# Patient Record
Sex: Male | Born: 1950 | Race: Black or African American | Hispanic: No | Smoking: Current every day smoker
Health system: Southern US, Community
[De-identification: ages and names within clinical notes are randomized; demographics above are authoritative.]

## PROBLEM LIST (undated history)

## (undated) DIAGNOSIS — N189 Chronic kidney disease, unspecified: Secondary | ICD-10-CM

## (undated) DIAGNOSIS — E119 Type 2 diabetes mellitus without complications: Secondary | ICD-10-CM

## (undated) DIAGNOSIS — E785 Hyperlipidemia, unspecified: Secondary | ICD-10-CM

## (undated) DIAGNOSIS — J40 Bronchitis, not specified as acute or chronic: Secondary | ICD-10-CM

## (undated) DIAGNOSIS — I4891 Unspecified atrial fibrillation: Secondary | ICD-10-CM

## (undated) DIAGNOSIS — I499 Cardiac arrhythmia, unspecified: Secondary | ICD-10-CM

## (undated) DIAGNOSIS — G4730 Sleep apnea, unspecified: Secondary | ICD-10-CM

## (undated) DIAGNOSIS — I10 Essential (primary) hypertension: Secondary | ICD-10-CM

## (undated) HISTORY — PX: HIP SURGERY: SHX245

## (undated) HISTORY — DX: Type 2 diabetes mellitus without complications: E11.9

## (undated) HISTORY — PX: OTHER SURGICAL HISTORY: SHX169

## (undated) HISTORY — DX: Hyperlipidemia, unspecified: E78.5

## (undated) HISTORY — PX: FOOT SURGERY: SHX648

## (undated) HISTORY — DX: Essential (primary) hypertension: I10

## (undated) HISTORY — PX: CHOLECYSTECTOMY: SHX55

## (undated) HISTORY — PX: ROTATOR CUFF REPAIR: SHX139

---

## 2001-12-06 ENCOUNTER — Emergency Department (HOSPITAL_COMMUNITY): Admission: EM | Admit: 2001-12-06 | Discharge: 2001-12-06 | Payer: Self-pay | Admitting: Emergency Medicine

## 2002-04-25 ENCOUNTER — Encounter: Payer: Self-pay | Admitting: Internal Medicine

## 2002-04-25 ENCOUNTER — Ambulatory Visit (HOSPITAL_COMMUNITY): Admission: RE | Admit: 2002-04-25 | Discharge: 2002-04-25 | Payer: Self-pay | Admitting: Internal Medicine

## 2002-09-03 ENCOUNTER — Ambulatory Visit (HOSPITAL_COMMUNITY): Admission: RE | Admit: 2002-09-03 | Discharge: 2002-09-03 | Payer: Self-pay | Admitting: Family Medicine

## 2002-09-03 ENCOUNTER — Encounter: Payer: Self-pay | Admitting: Family Medicine

## 2002-09-19 ENCOUNTER — Observation Stay (HOSPITAL_COMMUNITY): Admission: RE | Admit: 2002-09-19 | Discharge: 2002-09-20 | Payer: Self-pay | Admitting: General Surgery

## 2006-08-23 ENCOUNTER — Ambulatory Visit (HOSPITAL_COMMUNITY): Admission: RE | Admit: 2006-08-23 | Discharge: 2006-08-23 | Payer: Self-pay | Admitting: Family Medicine

## 2006-11-14 ENCOUNTER — Ambulatory Visit (HOSPITAL_COMMUNITY): Admission: RE | Admit: 2006-11-14 | Discharge: 2006-11-14 | Payer: Self-pay | Admitting: Family Medicine

## 2007-06-13 ENCOUNTER — Ambulatory Visit (HOSPITAL_COMMUNITY): Admission: RE | Admit: 2007-06-13 | Discharge: 2007-06-13 | Payer: Self-pay | Admitting: Family Medicine

## 2007-06-30 ENCOUNTER — Encounter: Admission: RE | Admit: 2007-06-30 | Discharge: 2007-06-30 | Payer: Self-pay | Admitting: Family Medicine

## 2010-09-17 ENCOUNTER — Other Ambulatory Visit (HOSPITAL_COMMUNITY): Payer: Self-pay | Admitting: Urology

## 2010-09-17 DIAGNOSIS — R3129 Other microscopic hematuria: Secondary | ICD-10-CM

## 2010-09-22 ENCOUNTER — Ambulatory Visit (HOSPITAL_COMMUNITY)
Admission: RE | Admit: 2010-09-22 | Discharge: 2010-09-22 | Disposition: A | Discharge status: Home / Self Care | Payer: BC Managed Care – PPO | Source: Ambulatory Visit | Attending: Urology | Admitting: Urology

## 2010-09-22 DIAGNOSIS — R3129 Other microscopic hematuria: Secondary | ICD-10-CM | POA: Insufficient documentation

## 2010-09-22 DIAGNOSIS — R9389 Abnormal findings on diagnostic imaging of other specified body structures: Secondary | ICD-10-CM | POA: Insufficient documentation

## 2010-09-22 MED ORDER — IOHEXOL 300 MG/ML  SOLN
125.0000 mL | Freq: Once | INTRAMUSCULAR | Status: AC | PRN
  Administered 2010-09-22: 125 mL via INTRAVENOUS

## 2010-10-08 ENCOUNTER — Other Ambulatory Visit (HOSPITAL_COMMUNITY): Payer: Self-pay | Admitting: Urology

## 2010-10-08 DIAGNOSIS — N2889 Other specified disorders of kidney and ureter: Secondary | ICD-10-CM

## 2010-10-13 ENCOUNTER — Other Ambulatory Visit (HOSPITAL_COMMUNITY): Payer: Self-pay | Admitting: Urology

## 2010-10-13 ENCOUNTER — Ambulatory Visit (HOSPITAL_COMMUNITY)
Admission: RE | Admit: 2010-10-13 | Discharge: 2010-10-13 | Disposition: A | Discharge status: Home / Self Care | Payer: BC Managed Care – PPO | Source: Ambulatory Visit | Attending: Urology | Admitting: Urology

## 2010-10-13 DIAGNOSIS — N2889 Other specified disorders of kidney and ureter: Secondary | ICD-10-CM

## 2010-10-13 DIAGNOSIS — R3129 Other microscopic hematuria: Secondary | ICD-10-CM | POA: Insufficient documentation

## 2010-10-13 DIAGNOSIS — R9389 Abnormal findings on diagnostic imaging of other specified body structures: Secondary | ICD-10-CM | POA: Insufficient documentation

## 2010-10-13 MED ORDER — GADOBENATE DIMEGLUMINE 529 MG/ML IV SOLN: 20.0000 mL | Freq: Once | INTRAVENOUS | Status: AC | PRN

## 2010-11-11 ENCOUNTER — Other Ambulatory Visit (HOSPITAL_COMMUNITY): Payer: Self-pay | Admitting: Podiatry

## 2010-11-11 DIAGNOSIS — M79669 Pain in unspecified lower leg: Secondary | ICD-10-CM

## 2010-11-11 DIAGNOSIS — I82409 Acute embolism and thrombosis of unspecified deep veins of unspecified lower extremity: Secondary | ICD-10-CM

## 2010-11-12 ENCOUNTER — Ambulatory Visit (HOSPITAL_COMMUNITY)
Admission: RE | Admit: 2010-11-12 | Discharge: 2010-11-12 | Disposition: A | Discharge status: Home / Self Care | Payer: BC Managed Care – PPO | Source: Ambulatory Visit | Attending: Podiatry | Admitting: Podiatry

## 2010-11-12 DIAGNOSIS — M79609 Pain in unspecified limb: Secondary | ICD-10-CM | POA: Insufficient documentation

## 2010-11-12 DIAGNOSIS — M79669 Pain in unspecified lower leg: Secondary | ICD-10-CM

## 2010-11-12 DIAGNOSIS — I82409 Acute embolism and thrombosis of unspecified deep veins of unspecified lower extremity: Secondary | ICD-10-CM

## 2010-11-13 NOTE — Op Note (Signed)
NAME:  Alex Morton, Alex Morton                     ACCOUNT NO.:  [id number]   MEDICAL RECORD NO.:  [id number]                   PATIENT TYPE:  AMB   LOCATION:  DAY                                  FACILITY:  [facility]   PHYSICIAN:  Barbaraann Barthel, M.D.              DATE OF BIRTH:  07-Jan-1951   DATE OF PROCEDURE:  09/19/2002  DATE OF DISCHARGE:                                 OPERATIVE REPORT   SURGEON:  Barbaraann Barthel, M.D.   ASSISTANT:  Dr. Emelda Fear.   PREOPERATIVE DIAGNOSES:  Cholecystitis secondary to cholelithiasis.   POSTOPERATIVE DIAGNOSES:  Cholecystitis secondary to cholelithiasis.   PROCEDURE:  Laparoscopic cholecystectomy.   SPECIMENS:  Gallbladder with stones.   INDICATIONS FOR PROCEDURE:  Note, this is a 60 year old black male who had  long-term complaints of right upper quadrant pain. He was noted on  sonography to have a thickened gallbladder with multiple stones within it.  He was nontender and his liver function studies and amylase were within  normal limits preoperatively.   We discussed surgery with this patient discussion complications not limited  to but including bleeding, infection, damage to bile ducts, perforation of  organs and transitory diarrhea. Informed consent was obtained.   GROSS FINDINGS:  The patient had a very thickened wizened type of  gallbladder with multiple small stones within it, a short cystic duct which  was not cannulated and multiple adhesions in the right upper quadrant. In  order to grasp the gallbladder, we had to remove about 5 mL of sludge from  the gallbladder.   TECHNIQUE:  The patient was placed in the supine position and after the  adequate administration of general anesthesia via endotracheal intubation, a  Foley catheter was aseptically inserted. The patient was shaved, prepped and  draped in the usual manner. A periumbilical incision was carried out over  the superior aspect of the umbilicus. The fascia was grasped with  a sharp  towel clip and with the patient in Trendelenburg and elevating the fascia, a  Veress needle was inserted and confirmed in position with a saline drop  test. The gallbladder was then insufflated with approximately 3.5 liters of  CO2 and then using the Visiport technique, an 11 mm [name] surgical cannula was  placed in the umbilicus. Then under direct vision, three other cannulas were  placed, an 11 mm cannula in the epigastrium, and two 5 mm cannulas in the  right upper quadrant laterally. The gallbladder was grasped, it was  difficult to grasp this. I inserted a needle through the 5 mm lateral  cannula to decompress the gallbladder somewhat so we were able to grasp  this. This was done and with tedious dissection the cystic duct was  identified. This was triply silver clipped and divided as was the cystic  artery. The cystic artery came from a rather large vessel, perhaps the  hepatic artery. We removed the gallbladder then from the liver bed using  the  hook cautery device without incident. There was some oozing. This was  controlled with the cautery device and after irrigating I elected to leave a  Jackson-Pratt drain in the gallbladder bed. The gallbladder was then removed  with its stones some of which had to be retrieved from the right upper  quadrant using the EndoCatch device and then after checking for hemostasis  and irrigating the abdomen was then desufflated and the fascia was closed in  the area of the 11 mm cannula insertion sites with #0 Polysorb. Half percent  Sensorcaine was infused in the cannula site and all skin incisions were  closed with the stapling device with OpSite dressings, drains and sponges  applied. The drain was sutured in place with 3-0 nylon. Prior to closure,  all sponge, needle and instrument counts were found to be correct. Estimated  blood loss was minimal. The patient received 1900 mL of Crystalloids  intraoperatively. There were no  complications.                                               Barbaraann Barthel, M.D.    Jodi Marble  D:  09/19/2002  T:  09/19/2002  Job:  [id number]   cc:   Corrie Mckusick, M.D.  [address] [place]  [place]  [place] [address]  Fax: [phone]

## 2012-09-12 ENCOUNTER — Telehealth (HOSPITAL_COMMUNITY): Payer: Self-pay | Admitting: Dietician

## 2012-09-12 NOTE — Telephone Encounter (Signed)
Pt registered to attend group diabetes class at [facility] on 09/12/12. However, pt was a no-show.  

## 2013-08-07 ENCOUNTER — Telehealth: Payer: Self-pay

## 2013-08-07 NOTE — Telephone Encounter (Signed)
Pt was referred by Edythe Clarity, PA at Dr. Delanna Ahmadi office, for a screening colonoscopy. LMOM for a return call.

## 2013-08-16 NOTE — Telephone Encounter (Signed)
Letter mailed to pt with new address. First one was returned.

## 2014-01-29 ENCOUNTER — Ambulatory Visit (INDEPENDENT_AMBULATORY_CARE_PROVIDER_SITE_OTHER): Payer: BC Managed Care – PPO

## 2014-01-29 ENCOUNTER — Ambulatory Visit: Payer: BC Managed Care – PPO

## 2014-01-29 ENCOUNTER — Ambulatory Visit (INDEPENDENT_AMBULATORY_CARE_PROVIDER_SITE_OTHER): Payer: BC Managed Care – PPO | Admitting: Orthopedic Surgery

## 2014-01-29 VITALS — BP 147/84 | Ht 73.0 in | Wt 247.0 lb

## 2014-01-29 DIAGNOSIS — M25562 Pain in left knee: Secondary | ICD-10-CM

## 2014-01-29 DIAGNOSIS — M25569 Pain in unspecified knee: Secondary | ICD-10-CM

## 2014-01-29 DIAGNOSIS — M25561 Pain in right knee: Secondary | ICD-10-CM

## 2014-01-29 NOTE — Progress Notes (Signed)
Subjective:     Patient ID: DHAVAL WOO, male   DOB: [birth year], 63 y.o.   MRN: [id number]  HPI Pain left knee. The patient is referred to [name] for left knee pain which was Shona Needles. He complains of pain and some numbness and tingling in his left knee medial side radiating to his foot associated with burning. She says it comes and goes and is now gone. He has some right lower extremity radicular symptoms which were relieved by gabapentin  His medical history of diabetes and gastric reflux disease with hypertension  He's had multiple surgeries including cholecystectomy foot surgery hip surgery shoulder surgery  He takes metoprolol amlodipine metformin gabapentin and nesina  His allergies are none his family history shows diabetes lung disease hypertension aerobic heart attack coronary artery disease and heart disease  Review of systems  Review of Systems Review of systems has been recorded reviewed and signed and scanned into the chart     Objective:   Physical Exam BP 147/84  Ht 6\' 1"  (1.854 m)  Wt 247 lb (112.038 kg)  BMI 32.59 kg/m2 General appearance is normal, the patient is alert and oriented x3 with normal mood and affect. The patient is walking normally  Both knees looked good without swelling or tenderness. His range of motion is normal stability and strength are excellent skin is intact he has good distal pulses normal sensation no lymphadenopathy and normal reflexes    Assessment:     Knee pain potential is some sciatic-type pain    Plan:     Call office when symptoms occur again

## 2015-03-19 ENCOUNTER — Other Ambulatory Visit (HOSPITAL_COMMUNITY): Payer: Self-pay | Admitting: Physician Assistant

## 2015-03-21 ENCOUNTER — Other Ambulatory Visit (HOSPITAL_COMMUNITY): Payer: Self-pay | Admitting: Physician Assistant

## 2016-01-15 DIAGNOSIS — I10 Essential (primary) hypertension: Secondary | ICD-10-CM | POA: Diagnosis not present

## 2016-01-15 DIAGNOSIS — N183 Chronic kidney disease, stage 3 (moderate): Secondary | ICD-10-CM | POA: Diagnosis not present

## 2016-01-15 DIAGNOSIS — E1129 Type 2 diabetes mellitus with other diabetic kidney complication: Secondary | ICD-10-CM | POA: Diagnosis not present

## 2016-01-15 DIAGNOSIS — R809 Proteinuria, unspecified: Secondary | ICD-10-CM | POA: Diagnosis not present

## 2016-01-23 ENCOUNTER — Other Ambulatory Visit (HOSPITAL_COMMUNITY): Payer: Self-pay | Admitting: Nephrology

## 2016-01-23 DIAGNOSIS — N183 Chronic kidney disease, stage 3 unspecified: Secondary | ICD-10-CM

## 2016-02-11 ENCOUNTER — Ambulatory Visit (HOSPITAL_COMMUNITY)
Admission: RE | Admit: 2016-02-11 | Discharge: 2016-02-11 | Disposition: A | Discharge status: Home / Self Care | Payer: Medicare Other | Source: Ambulatory Visit | Attending: Nephrology | Admitting: Nephrology

## 2016-02-11 DIAGNOSIS — N183 Chronic kidney disease, stage 3 unspecified: Secondary | ICD-10-CM

## 2016-02-11 DIAGNOSIS — I10 Essential (primary) hypertension: Secondary | ICD-10-CM | POA: Diagnosis not present

## 2016-02-11 DIAGNOSIS — E559 Vitamin D deficiency, unspecified: Secondary | ICD-10-CM | POA: Diagnosis not present

## 2016-02-11 DIAGNOSIS — R809 Proteinuria, unspecified: Secondary | ICD-10-CM | POA: Diagnosis not present

## 2016-02-11 DIAGNOSIS — D509 Iron deficiency anemia, unspecified: Secondary | ICD-10-CM | POA: Diagnosis not present

## 2016-02-11 DIAGNOSIS — Z79899 Other long term (current) drug therapy: Secondary | ICD-10-CM | POA: Diagnosis not present

## 2016-03-03 DIAGNOSIS — R809 Proteinuria, unspecified: Secondary | ICD-10-CM | POA: Diagnosis not present

## 2016-03-03 DIAGNOSIS — E538 Deficiency of other specified B group vitamins: Secondary | ICD-10-CM | POA: Diagnosis not present

## 2016-03-03 DIAGNOSIS — N183 Chronic kidney disease, stage 3 (moderate): Secondary | ICD-10-CM | POA: Diagnosis not present

## 2016-03-03 DIAGNOSIS — E559 Vitamin D deficiency, unspecified: Secondary | ICD-10-CM | POA: Diagnosis not present

## 2016-06-24 DIAGNOSIS — D509 Iron deficiency anemia, unspecified: Secondary | ICD-10-CM | POA: Diagnosis not present

## 2016-06-24 DIAGNOSIS — E559 Vitamin D deficiency, unspecified: Secondary | ICD-10-CM | POA: Diagnosis not present

## 2016-06-24 DIAGNOSIS — Z79899 Other long term (current) drug therapy: Secondary | ICD-10-CM | POA: Diagnosis not present

## 2016-06-24 DIAGNOSIS — N183 Chronic kidney disease, stage 3 (moderate): Secondary | ICD-10-CM | POA: Diagnosis not present

## 2016-06-24 DIAGNOSIS — R809 Proteinuria, unspecified: Secondary | ICD-10-CM | POA: Diagnosis not present

## 2016-06-24 DIAGNOSIS — I10 Essential (primary) hypertension: Secondary | ICD-10-CM | POA: Diagnosis not present

## 2016-06-29 DIAGNOSIS — E1129 Type 2 diabetes mellitus with other diabetic kidney complication: Secondary | ICD-10-CM | POA: Diagnosis not present

## 2016-06-29 DIAGNOSIS — N183 Chronic kidney disease, stage 3 (moderate): Secondary | ICD-10-CM | POA: Diagnosis not present

## 2016-06-29 DIAGNOSIS — Z0001 Encounter for general adult medical examination with abnormal findings: Secondary | ICD-10-CM | POA: Diagnosis not present

## 2016-06-29 DIAGNOSIS — E538 Deficiency of other specified B group vitamins: Secondary | ICD-10-CM | POA: Diagnosis not present

## 2016-06-29 DIAGNOSIS — E559 Vitamin D deficiency, unspecified: Secondary | ICD-10-CM | POA: Diagnosis not present

## 2016-06-30 DIAGNOSIS — D649 Anemia, unspecified: Secondary | ICD-10-CM | POA: Diagnosis not present

## 2016-06-30 DIAGNOSIS — N182 Chronic kidney disease, stage 2 (mild): Secondary | ICD-10-CM | POA: Diagnosis not present

## 2016-06-30 DIAGNOSIS — I10 Essential (primary) hypertension: Secondary | ICD-10-CM | POA: Diagnosis not present

## 2016-06-30 DIAGNOSIS — R809 Proteinuria, unspecified: Secondary | ICD-10-CM | POA: Diagnosis not present

## 2016-06-30 DIAGNOSIS — N250 Renal osteodystrophy: Secondary | ICD-10-CM | POA: Diagnosis not present

## 2016-08-04 DIAGNOSIS — E538 Deficiency of other specified B group vitamins: Secondary | ICD-10-CM | POA: Diagnosis not present

## 2016-08-31 DIAGNOSIS — E538 Deficiency of other specified B group vitamins: Secondary | ICD-10-CM | POA: Diagnosis not present

## 2016-11-02 DIAGNOSIS — E538 Deficiency of other specified B group vitamins: Secondary | ICD-10-CM | POA: Diagnosis not present

## 2016-11-08 DIAGNOSIS — N183 Chronic kidney disease, stage 3 (moderate): Secondary | ICD-10-CM | POA: Diagnosis not present

## 2016-11-08 DIAGNOSIS — R809 Proteinuria, unspecified: Secondary | ICD-10-CM | POA: Diagnosis not present

## 2016-11-08 DIAGNOSIS — D509 Iron deficiency anemia, unspecified: Secondary | ICD-10-CM | POA: Diagnosis not present

## 2016-11-08 DIAGNOSIS — E559 Vitamin D deficiency, unspecified: Secondary | ICD-10-CM | POA: Diagnosis not present

## 2016-11-08 DIAGNOSIS — Z79899 Other long term (current) drug therapy: Secondary | ICD-10-CM | POA: Diagnosis not present

## 2016-11-08 DIAGNOSIS — I10 Essential (primary) hypertension: Secondary | ICD-10-CM | POA: Diagnosis not present

## 2016-11-10 DIAGNOSIS — D649 Anemia, unspecified: Secondary | ICD-10-CM | POA: Diagnosis not present

## 2016-11-10 DIAGNOSIS — N183 Chronic kidney disease, stage 3 (moderate): Secondary | ICD-10-CM | POA: Diagnosis not present

## 2016-11-10 DIAGNOSIS — N250 Renal osteodystrophy: Secondary | ICD-10-CM | POA: Diagnosis not present

## 2016-11-10 DIAGNOSIS — R809 Proteinuria, unspecified: Secondary | ICD-10-CM | POA: Diagnosis not present

## 2016-11-10 DIAGNOSIS — I10 Essential (primary) hypertension: Secondary | ICD-10-CM | POA: Diagnosis not present

## 2016-11-29 DIAGNOSIS — Z1389 Encounter for screening for other disorder: Secondary | ICD-10-CM | POA: Diagnosis not present

## 2016-11-29 DIAGNOSIS — I10 Essential (primary) hypertension: Secondary | ICD-10-CM | POA: Diagnosis not present

## 2016-11-29 DIAGNOSIS — M25532 Pain in left wrist: Secondary | ICD-10-CM | POA: Diagnosis not present

## 2016-11-29 DIAGNOSIS — E1140 Type 2 diabetes mellitus with diabetic neuropathy, unspecified: Secondary | ICD-10-CM | POA: Diagnosis not present

## 2016-12-16 DIAGNOSIS — E1151 Type 2 diabetes mellitus with diabetic peripheral angiopathy without gangrene: Secondary | ICD-10-CM | POA: Diagnosis not present

## 2016-12-16 DIAGNOSIS — B351 Tinea unguium: Secondary | ICD-10-CM | POA: Diagnosis not present

## 2016-12-16 DIAGNOSIS — E1140 Type 2 diabetes mellitus with diabetic neuropathy, unspecified: Secondary | ICD-10-CM | POA: Diagnosis not present

## 2016-12-16 DIAGNOSIS — L110 Acquired keratosis follicularis: Secondary | ICD-10-CM | POA: Diagnosis not present

## 2017-02-09 ENCOUNTER — Other Ambulatory Visit (HOSPITAL_COMMUNITY): Payer: Self-pay | Admitting: Family Medicine

## 2017-02-09 ENCOUNTER — Ambulatory Visit (HOSPITAL_COMMUNITY)
Admission: RE | Admit: 2017-02-09 | Discharge: 2017-02-09 | Disposition: A | Discharge status: Home / Self Care | Payer: Medicare Other | Source: Ambulatory Visit | Attending: Family Medicine | Admitting: Family Medicine

## 2017-02-09 DIAGNOSIS — M19042 Primary osteoarthritis, left hand: Secondary | ICD-10-CM | POA: Insufficient documentation

## 2017-02-09 DIAGNOSIS — M25532 Pain in left wrist: Secondary | ICD-10-CM | POA: Diagnosis not present

## 2017-02-09 DIAGNOSIS — M24812 Other specific joint derangements of left shoulder, not elsewhere classified: Secondary | ICD-10-CM | POA: Diagnosis not present

## 2017-02-09 DIAGNOSIS — M19032 Primary osteoarthritis, left wrist: Secondary | ICD-10-CM | POA: Diagnosis not present

## 2017-02-09 DIAGNOSIS — E538 Deficiency of other specified B group vitamins: Secondary | ICD-10-CM | POA: Diagnosis not present

## 2017-02-09 DIAGNOSIS — M79642 Pain in left hand: Secondary | ICD-10-CM | POA: Diagnosis not present

## 2017-02-09 DIAGNOSIS — Z1389 Encounter for screening for other disorder: Secondary | ICD-10-CM | POA: Diagnosis not present

## 2017-02-22 DIAGNOSIS — M19032 Primary osteoarthritis, left wrist: Secondary | ICD-10-CM | POA: Diagnosis not present

## 2017-02-22 DIAGNOSIS — M25532 Pain in left wrist: Secondary | ICD-10-CM | POA: Diagnosis not present

## 2017-03-16 DIAGNOSIS — E1129 Type 2 diabetes mellitus with other diabetic kidney complication: Secondary | ICD-10-CM | POA: Diagnosis not present

## 2017-03-16 DIAGNOSIS — Z720 Tobacco use: Secondary | ICD-10-CM | POA: Diagnosis not present

## 2017-03-16 DIAGNOSIS — N183 Chronic kidney disease, stage 3 (moderate): Secondary | ICD-10-CM | POA: Diagnosis not present

## 2017-03-16 DIAGNOSIS — R809 Proteinuria, unspecified: Secondary | ICD-10-CM | POA: Diagnosis not present

## 2017-03-16 DIAGNOSIS — I10 Essential (primary) hypertension: Secondary | ICD-10-CM | POA: Diagnosis not present

## 2017-03-17 DIAGNOSIS — E1140 Type 2 diabetes mellitus with diabetic neuropathy, unspecified: Secondary | ICD-10-CM | POA: Diagnosis not present

## 2017-03-17 DIAGNOSIS — L110 Acquired keratosis follicularis: Secondary | ICD-10-CM | POA: Diagnosis not present

## 2017-03-17 DIAGNOSIS — B351 Tinea unguium: Secondary | ICD-10-CM | POA: Diagnosis not present

## 2017-03-17 DIAGNOSIS — E1151 Type 2 diabetes mellitus with diabetic peripheral angiopathy without gangrene: Secondary | ICD-10-CM | POA: Diagnosis not present

## 2017-03-28 ENCOUNTER — Other Ambulatory Visit: Payer: Self-pay | Admitting: *Deleted

## 2017-03-28 ENCOUNTER — Ambulatory Visit (INDEPENDENT_AMBULATORY_CARE_PROVIDER_SITE_OTHER): Payer: Medicare Other | Admitting: Orthopedic Surgery

## 2017-03-28 VITALS — BP 139/92 | HR 55 | Ht 73.0 in | Wt 251.0 lb

## 2017-03-28 DIAGNOSIS — M19039 Primary osteoarthritis, unspecified wrist: Secondary | ICD-10-CM | POA: Diagnosis not present

## 2017-03-28 DIAGNOSIS — M25512 Pain in left shoulder: Secondary | ICD-10-CM

## 2017-03-28 DIAGNOSIS — M7502 Adhesive capsulitis of left shoulder: Secondary | ICD-10-CM

## 2017-03-28 MED ORDER — MELOXICAM 7.5 MG PO TABS: 7.5000 mg | ORAL_TABLET | Freq: Every day | ORAL | 1 refills | Status: DC

## 2017-03-28 NOTE — Patient Instructions (Signed)
You have received an injection of steroids into the joint. 15% of patients will have increased pain within the 24 hours postinjection.   This is transient and will go away.   We recommend that you use ice packs on the injection site for 20 minutes every 2 hours and extra strength Tylenol 2 tablets every 8 as needed until the pain resolves.  If you continue to have pain after taking the Tylenol and using the ice please call the office for further instructions.  Start therapy  Stop brace  Start MOBIC 7.5 mg daily

## 2017-03-28 NOTE — Progress Notes (Signed)
NEW PATIENT OFFICE VISIT    Chief Complaint  Patient presents with  . Follow-up    ER follow up on left wrist, no injury.    66 year old male presents with painful left wrist.  He is 66 years old he is retired from [employer] he presents with a two-month history of pain and swelling in his left wrist which was treated in the ER with a topical medications including diclofenac Lido cane and menthol with some relief of his pain. He wore brace and he complains now of reduce pain but continued swelling in the left wrist.  He also notes that since that time he has acute onset of loss of motion in the left shoulder with dull aching constant mild to moderate pain over the anterior joint line with no history of trauma    Review of Systems  Respiratory: Negative for shortness of breath.   Cardiovascular: Negative for chest pain.  Skin: Negative.   Neurological: Negative.    Family history of heart disease mom and dad asthma sister and heart disease brother  Prior history of surgery includes foot surgery shoulder surgery gallbladder surgery and a second foot surgery  Medical conditions include diabetes pneumonia hypertension and arthritis   Social History  Substance Use Topics  . Smoking status: Not on file  . Smokeless tobacco: Not on file  . Alcohol use Not on file    BP (!) 139/92   Pulse (!) 55   Ht 6\' 1"  (1.854 m)   Wt 251 lb (113.9 kg)   BMI 33.12 kg/m   Physical Exam  Constitutional: He is oriented to person, place, and time. He appears well-developed and well-nourished.  Vital signs have been reviewed and are stable. Gen. appearance the patient is well-developed and well-nourished with normal grooming and hygiene.   Musculoskeletal:  GAIT IS normal  Neurological: He is alert and oriented to person, place, and time.  Skin: Skin is warm and dry. No erythema.  Psychiatric: He has a normal mood and affect.  Vitals reviewed.   Ortho Exam   I will start with his left  wrist which is swollen and tender as is his hand distal to the last wrap the brace. He has no tenderness except over the wrist joint is range of motion is somewhat diminished but painless there is no instability in the wrist he can make a full fist because of the swelling skin is intact no rash no laceration no previous ulceration neurovascular exam intact with good capillary refill normal sensation in all the involved nerves  As far as his shoulder goes he has a lot of shoulder has some tenderness over the rotator interval none of the acromial clavicular joint posterior joint line nontender I can only externally rotate his arms to neutral like an abducted 70 I can flex it 60 I couldn't test stability because the arm couldn't be brought abduction external rotation internal and external rotation abduction strength was normal with his arm at his side it did not see a rash over the skin of the shoulder or neck area. Normal sensation in the left arm normal pulses perfusion was good his neck was nontender  Meds ordered this encounter  Medications  . meloxicam (MOBIC) 7.5 MG tablet    Sig: Take 1 tablet (7.5 mg total) by mouth daily.    Dispense:  30 tablet    Refill:  1    Encounter Diagnoses  Name Primary?  . Pain in joint of left shoulder Yes  .  Wrist arthritis   . Adhesive capsulitis of left shoulder      PLAN:   Procedure note the subacromial injection shoulder left   Verbal consent was obtained to inject the  Left   Shoulder  Timeout was completed to confirm the injection site is a subacromial space of the  left  shoulder  Medication used Depo-Medrol 40 mg and lidocaine 1% 3 cc  Anesthesia was provided by ethyl chloride  The injection was performed in the left  posterior subacromial space. After pinning the skin with alcohol and anesthetized the skin with ethyl chloride the subacromial space was injected using a 20-gauge needle. There were no complications  Sterile dressing was  applied.  Meds ordered this encounter  Medications  . meloxicam (MOBIC) 7.5 MG tablet    Sig: Take 1 tablet (7.5 mg total) by mouth daily.    Dispense:  30 tablet    Refill:  1    2 months fu

## 2017-03-31 DIAGNOSIS — M542 Cervicalgia: Secondary | ICD-10-CM | POA: Diagnosis not present

## 2017-03-31 DIAGNOSIS — M6281 Muscle weakness (generalized): Secondary | ICD-10-CM | POA: Diagnosis not present

## 2017-03-31 DIAGNOSIS — M79602 Pain in left arm: Secondary | ICD-10-CM | POA: Diagnosis not present

## 2017-03-31 DIAGNOSIS — M5412 Radiculopathy, cervical region: Secondary | ICD-10-CM | POA: Diagnosis not present

## 2017-04-01 DIAGNOSIS — M542 Cervicalgia: Secondary | ICD-10-CM | POA: Diagnosis not present

## 2017-04-01 DIAGNOSIS — M6281 Muscle weakness (generalized): Secondary | ICD-10-CM | POA: Diagnosis not present

## 2017-04-01 DIAGNOSIS — M79602 Pain in left arm: Secondary | ICD-10-CM | POA: Diagnosis not present

## 2017-04-01 DIAGNOSIS — M5412 Radiculopathy, cervical region: Secondary | ICD-10-CM | POA: Diagnosis not present

## 2017-04-05 DIAGNOSIS — M79602 Pain in left arm: Secondary | ICD-10-CM | POA: Diagnosis not present

## 2017-04-05 DIAGNOSIS — M5412 Radiculopathy, cervical region: Secondary | ICD-10-CM | POA: Diagnosis not present

## 2017-04-05 DIAGNOSIS — M6281 Muscle weakness (generalized): Secondary | ICD-10-CM | POA: Diagnosis not present

## 2017-04-05 DIAGNOSIS — M542 Cervicalgia: Secondary | ICD-10-CM | POA: Diagnosis not present

## 2017-04-07 DIAGNOSIS — M542 Cervicalgia: Secondary | ICD-10-CM | POA: Diagnosis not present

## 2017-04-07 DIAGNOSIS — M6281 Muscle weakness (generalized): Secondary | ICD-10-CM | POA: Diagnosis not present

## 2017-04-07 DIAGNOSIS — M79602 Pain in left arm: Secondary | ICD-10-CM | POA: Diagnosis not present

## 2017-04-07 DIAGNOSIS — M5412 Radiculopathy, cervical region: Secondary | ICD-10-CM | POA: Diagnosis not present

## 2017-04-08 DIAGNOSIS — M79602 Pain in left arm: Secondary | ICD-10-CM | POA: Diagnosis not present

## 2017-04-08 DIAGNOSIS — M5412 Radiculopathy, cervical region: Secondary | ICD-10-CM | POA: Diagnosis not present

## 2017-04-08 DIAGNOSIS — M542 Cervicalgia: Secondary | ICD-10-CM | POA: Diagnosis not present

## 2017-04-08 DIAGNOSIS — M6281 Muscle weakness (generalized): Secondary | ICD-10-CM | POA: Diagnosis not present

## 2017-04-11 DIAGNOSIS — M6281 Muscle weakness (generalized): Secondary | ICD-10-CM | POA: Diagnosis not present

## 2017-04-11 DIAGNOSIS — M542 Cervicalgia: Secondary | ICD-10-CM | POA: Diagnosis not present

## 2017-04-11 DIAGNOSIS — M5412 Radiculopathy, cervical region: Secondary | ICD-10-CM | POA: Diagnosis not present

## 2017-04-11 DIAGNOSIS — M79602 Pain in left arm: Secondary | ICD-10-CM | POA: Diagnosis not present

## 2017-04-12 DIAGNOSIS — M542 Cervicalgia: Secondary | ICD-10-CM | POA: Diagnosis not present

## 2017-04-12 DIAGNOSIS — M6281 Muscle weakness (generalized): Secondary | ICD-10-CM | POA: Diagnosis not present

## 2017-04-12 DIAGNOSIS — M5412 Radiculopathy, cervical region: Secondary | ICD-10-CM | POA: Diagnosis not present

## 2017-04-12 DIAGNOSIS — M79602 Pain in left arm: Secondary | ICD-10-CM | POA: Diagnosis not present

## 2017-04-14 DIAGNOSIS — M6281 Muscle weakness (generalized): Secondary | ICD-10-CM | POA: Diagnosis not present

## 2017-04-14 DIAGNOSIS — M542 Cervicalgia: Secondary | ICD-10-CM | POA: Diagnosis not present

## 2017-04-14 DIAGNOSIS — M5412 Radiculopathy, cervical region: Secondary | ICD-10-CM | POA: Diagnosis not present

## 2017-04-14 DIAGNOSIS — M79602 Pain in left arm: Secondary | ICD-10-CM | POA: Diagnosis not present

## 2017-04-19 DIAGNOSIS — M542 Cervicalgia: Secondary | ICD-10-CM | POA: Diagnosis not present

## 2017-04-19 DIAGNOSIS — M5412 Radiculopathy, cervical region: Secondary | ICD-10-CM | POA: Diagnosis not present

## 2017-04-19 DIAGNOSIS — M79602 Pain in left arm: Secondary | ICD-10-CM | POA: Diagnosis not present

## 2017-04-19 DIAGNOSIS — M6281 Muscle weakness (generalized): Secondary | ICD-10-CM | POA: Diagnosis not present

## 2017-04-21 DIAGNOSIS — M5412 Radiculopathy, cervical region: Secondary | ICD-10-CM | POA: Diagnosis not present

## 2017-04-21 DIAGNOSIS — M6281 Muscle weakness (generalized): Secondary | ICD-10-CM | POA: Diagnosis not present

## 2017-04-21 DIAGNOSIS — M79602 Pain in left arm: Secondary | ICD-10-CM | POA: Diagnosis not present

## 2017-04-21 DIAGNOSIS — M542 Cervicalgia: Secondary | ICD-10-CM | POA: Diagnosis not present

## 2017-04-22 DIAGNOSIS — M5412 Radiculopathy, cervical region: Secondary | ICD-10-CM | POA: Diagnosis not present

## 2017-04-22 DIAGNOSIS — M79602 Pain in left arm: Secondary | ICD-10-CM | POA: Diagnosis not present

## 2017-04-22 DIAGNOSIS — M542 Cervicalgia: Secondary | ICD-10-CM | POA: Diagnosis not present

## 2017-04-22 DIAGNOSIS — M6281 Muscle weakness (generalized): Secondary | ICD-10-CM | POA: Diagnosis not present

## 2017-04-25 DIAGNOSIS — M5412 Radiculopathy, cervical region: Secondary | ICD-10-CM | POA: Diagnosis not present

## 2017-04-25 DIAGNOSIS — M6281 Muscle weakness (generalized): Secondary | ICD-10-CM | POA: Diagnosis not present

## 2017-04-25 DIAGNOSIS — M542 Cervicalgia: Secondary | ICD-10-CM | POA: Diagnosis not present

## 2017-04-25 DIAGNOSIS — M79602 Pain in left arm: Secondary | ICD-10-CM | POA: Diagnosis not present

## 2017-04-28 DIAGNOSIS — M542 Cervicalgia: Secondary | ICD-10-CM | POA: Diagnosis not present

## 2017-04-28 DIAGNOSIS — M6281 Muscle weakness (generalized): Secondary | ICD-10-CM | POA: Diagnosis not present

## 2017-04-28 DIAGNOSIS — M79602 Pain in left arm: Secondary | ICD-10-CM | POA: Diagnosis not present

## 2017-04-28 DIAGNOSIS — M5412 Radiculopathy, cervical region: Secondary | ICD-10-CM | POA: Diagnosis not present

## 2017-05-02 DIAGNOSIS — M542 Cervicalgia: Secondary | ICD-10-CM | POA: Diagnosis not present

## 2017-05-02 DIAGNOSIS — M79602 Pain in left arm: Secondary | ICD-10-CM | POA: Diagnosis not present

## 2017-05-02 DIAGNOSIS — M5412 Radiculopathy, cervical region: Secondary | ICD-10-CM | POA: Diagnosis not present

## 2017-05-02 DIAGNOSIS — M6281 Muscle weakness (generalized): Secondary | ICD-10-CM | POA: Diagnosis not present

## 2017-05-04 DIAGNOSIS — M79602 Pain in left arm: Secondary | ICD-10-CM | POA: Diagnosis not present

## 2017-05-04 DIAGNOSIS — M542 Cervicalgia: Secondary | ICD-10-CM | POA: Diagnosis not present

## 2017-05-04 DIAGNOSIS — M6281 Muscle weakness (generalized): Secondary | ICD-10-CM | POA: Diagnosis not present

## 2017-05-04 DIAGNOSIS — M5412 Radiculopathy, cervical region: Secondary | ICD-10-CM | POA: Diagnosis not present

## 2017-05-05 DIAGNOSIS — M542 Cervicalgia: Secondary | ICD-10-CM | POA: Diagnosis not present

## 2017-05-05 DIAGNOSIS — M5412 Radiculopathy, cervical region: Secondary | ICD-10-CM | POA: Diagnosis not present

## 2017-05-05 DIAGNOSIS — M79602 Pain in left arm: Secondary | ICD-10-CM | POA: Diagnosis not present

## 2017-05-05 DIAGNOSIS — M6281 Muscle weakness (generalized): Secondary | ICD-10-CM | POA: Diagnosis not present

## 2017-05-09 DIAGNOSIS — M79602 Pain in left arm: Secondary | ICD-10-CM | POA: Diagnosis not present

## 2017-05-09 DIAGNOSIS — M5412 Radiculopathy, cervical region: Secondary | ICD-10-CM | POA: Diagnosis not present

## 2017-05-09 DIAGNOSIS — M542 Cervicalgia: Secondary | ICD-10-CM | POA: Diagnosis not present

## 2017-05-09 DIAGNOSIS — M6281 Muscle weakness (generalized): Secondary | ICD-10-CM | POA: Diagnosis not present

## 2017-05-11 DIAGNOSIS — M5412 Radiculopathy, cervical region: Secondary | ICD-10-CM | POA: Diagnosis not present

## 2017-05-11 DIAGNOSIS — M542 Cervicalgia: Secondary | ICD-10-CM | POA: Diagnosis not present

## 2017-05-11 DIAGNOSIS — M79602 Pain in left arm: Secondary | ICD-10-CM | POA: Diagnosis not present

## 2017-05-11 DIAGNOSIS — M6281 Muscle weakness (generalized): Secondary | ICD-10-CM | POA: Diagnosis not present

## 2017-05-12 DIAGNOSIS — M79602 Pain in left arm: Secondary | ICD-10-CM | POA: Diagnosis not present

## 2017-05-12 DIAGNOSIS — M6281 Muscle weakness (generalized): Secondary | ICD-10-CM | POA: Diagnosis not present

## 2017-05-12 DIAGNOSIS — M5412 Radiculopathy, cervical region: Secondary | ICD-10-CM | POA: Diagnosis not present

## 2017-05-12 DIAGNOSIS — M542 Cervicalgia: Secondary | ICD-10-CM | POA: Diagnosis not present

## 2017-05-16 DIAGNOSIS — M542 Cervicalgia: Secondary | ICD-10-CM | POA: Diagnosis not present

## 2017-05-16 DIAGNOSIS — M79602 Pain in left arm: Secondary | ICD-10-CM | POA: Diagnosis not present

## 2017-05-16 DIAGNOSIS — M6281 Muscle weakness (generalized): Secondary | ICD-10-CM | POA: Diagnosis not present

## 2017-05-16 DIAGNOSIS — M5412 Radiculopathy, cervical region: Secondary | ICD-10-CM | POA: Diagnosis not present

## 2017-05-17 ENCOUNTER — Telehealth: Payer: Self-pay | Admitting: Orthopedic Surgery

## 2017-05-17 ENCOUNTER — Other Ambulatory Visit: Payer: Self-pay | Admitting: Orthopedic Surgery

## 2017-05-17 DIAGNOSIS — M25512 Pain in left shoulder: Secondary | ICD-10-CM

## 2017-05-17 DIAGNOSIS — M7502 Adhesive capsulitis of left shoulder: Secondary | ICD-10-CM

## 2017-05-17 MED ORDER — MELOXICAM 7.5 MG PO TABS: 7.5000 mg | ORAL_TABLET | Freq: Every day | ORAL | 5 refills | Status: DC

## 2017-05-17 NOTE — Telephone Encounter (Signed)
[facility] in [place] requesting 90 day supply for  Meloxicam 7.5 mg   Qty 90

## 2017-05-18 DIAGNOSIS — M79602 Pain in left arm: Secondary | ICD-10-CM | POA: Diagnosis not present

## 2017-05-18 DIAGNOSIS — M6281 Muscle weakness (generalized): Secondary | ICD-10-CM | POA: Diagnosis not present

## 2017-05-18 DIAGNOSIS — M542 Cervicalgia: Secondary | ICD-10-CM | POA: Diagnosis not present

## 2017-05-18 DIAGNOSIS — M5412 Radiculopathy, cervical region: Secondary | ICD-10-CM | POA: Diagnosis not present

## 2017-05-23 DIAGNOSIS — M5412 Radiculopathy, cervical region: Secondary | ICD-10-CM | POA: Diagnosis not present

## 2017-05-23 DIAGNOSIS — M542 Cervicalgia: Secondary | ICD-10-CM | POA: Diagnosis not present

## 2017-05-23 DIAGNOSIS — M6281 Muscle weakness (generalized): Secondary | ICD-10-CM | POA: Diagnosis not present

## 2017-05-23 DIAGNOSIS — M79602 Pain in left arm: Secondary | ICD-10-CM | POA: Diagnosis not present

## 2017-05-25 DIAGNOSIS — M542 Cervicalgia: Secondary | ICD-10-CM | POA: Diagnosis not present

## 2017-05-25 DIAGNOSIS — M79602 Pain in left arm: Secondary | ICD-10-CM | POA: Diagnosis not present

## 2017-05-25 DIAGNOSIS — M5412 Radiculopathy, cervical region: Secondary | ICD-10-CM | POA: Diagnosis not present

## 2017-05-25 DIAGNOSIS — M6281 Muscle weakness (generalized): Secondary | ICD-10-CM | POA: Diagnosis not present

## 2017-05-30 ENCOUNTER — Encounter: Payer: Self-pay | Admitting: Orthopedic Surgery

## 2017-05-30 ENCOUNTER — Ambulatory Visit: Payer: Medicare Other | Admitting: Orthopedic Surgery

## 2017-05-30 VITALS — BP 149/84 | HR 49 | Ht 73.0 in | Wt 250.0 lb

## 2017-05-30 DIAGNOSIS — M5412 Radiculopathy, cervical region: Secondary | ICD-10-CM | POA: Diagnosis not present

## 2017-05-30 DIAGNOSIS — M7502 Adhesive capsulitis of left shoulder: Secondary | ICD-10-CM

## 2017-05-30 DIAGNOSIS — M25512 Pain in left shoulder: Secondary | ICD-10-CM

## 2017-05-30 DIAGNOSIS — M19039 Primary osteoarthritis, unspecified wrist: Secondary | ICD-10-CM | POA: Diagnosis not present

## 2017-05-30 DIAGNOSIS — M542 Cervicalgia: Secondary | ICD-10-CM | POA: Diagnosis not present

## 2017-05-30 DIAGNOSIS — M79602 Pain in left arm: Secondary | ICD-10-CM | POA: Diagnosis not present

## 2017-05-30 DIAGNOSIS — M6281 Muscle weakness (generalized): Secondary | ICD-10-CM | POA: Diagnosis not present

## 2017-05-30 MED ORDER — MELOXICAM 7.5 MG PO TABS: 7.5000 mg | ORAL_TABLET | Freq: Every day | ORAL | 5 refills | Status: DC

## 2017-05-30 NOTE — Progress Notes (Signed)
Progress Note   Patient ID: Alex Morton, male   DOB: [birth year], 66 y.o.   MRN: [id number]  Chief Complaint  Patient presents with  . Shoulder Pain    left/ improving with physical therapy   . 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   Neurological: Negative for tingling and sensory change.   Current Meds  Medication Sig  . amLODipine (NORVASC) 10 MG tablet Take 10 mg by mouth daily.  Marland Kitchen gabapentin (NEURONTIN) 600 MG tablet Take 600 mg by mouth 3 (three) times daily. Two tabs at bedtime  . glimepiride (AMARYL) 2 MG tablet   . meloxicam (MOBIC) 7.5 MG tablet Take 1 tablet (7.5 mg total) by mouth daily.  . metoprolol (LOPRESSOR) 100 MG tablet Take 100 mg by mouth 2 (two) times daily.  . [DISCONTINUED] amLODipine (NORVASC) 10 MG tablet     No Known Allergies   BP (!) 149/84   Pulse (!) 49   Ht 6\' 1"  (1.854 m)   Wt 250 lb (113.4 kg)   BMI 32.98 kg/m   Physical Exam  Constitutional: He is oriented to person, place, and time. He appears well-developed and well-nourished.  Vital signs have been reviewed and are stable. Gen. appearance the patient is well-developed and well-nourished with normal grooming and hygiene.   Musculoskeletal:       Left shoulder: He exhibits decreased range of motion. He exhibits no tenderness, no bony tenderness, no swelling, no effusion, no crepitus, no deformity, no laceration, no pain, no spasm, normal pulse and normal strength.       Left wrist: He exhibits decreased range of motion and tenderness. He exhibits no bony tenderness, no swelling, no effusion, no crepitus, no deformity and no laceration.       Left hand: He exhibits decreased range of motion and tenderness. He  exhibits no bony tenderness, normal capillary refill, no deformity and no laceration. Decreased sensation is not present in the ulnar distribution, is not present in the medial redistribution and is not present in the radial distribution. Decreased strength noted. He exhibits thumb/finger opposition and wrist extension trouble. He exhibits no finger abduction.     Neurological: He is alert and oriented to person, place, and time.  Skin: Skin is warm and dry. No erythema.  Psychiatric: He has a normal mood and affect.  Vitals reviewed.   Examination Medical decision-making Encounter Diagnoses  Name Primary?  . Adhesive capsulitis of left shoulder Yes  . Wrist arthritis    Adhesive capsulitis minimal improvement wrist arthritis significant improvement   Meds ordered this encounter  Medications  . meloxicam (MOBIC) 7.5 MG tablet    Sig: Take 1 tablet (7.5 mg total) by mouth daily.    Dispense:  90 tablet    Refill:  5   Recommend continuation of his physical therapy and meloxicam.  His last therapy note dated November 19 indicates good improvement in pain levels and range of motion cervical spine minimal gains in the shoulder joint recommend continue therapy  Arther Abbott, MD 05/30/2017 11:35 AM

## 2017-06-02 DIAGNOSIS — M79602 Pain in left arm: Secondary | ICD-10-CM | POA: Diagnosis not present

## 2017-06-02 DIAGNOSIS — M542 Cervicalgia: Secondary | ICD-10-CM | POA: Diagnosis not present

## 2017-06-02 DIAGNOSIS — M5412 Radiculopathy, cervical region: Secondary | ICD-10-CM | POA: Diagnosis not present

## 2017-06-02 DIAGNOSIS — M6281 Muscle weakness (generalized): Secondary | ICD-10-CM | POA: Diagnosis not present

## 2017-06-07 DIAGNOSIS — M6281 Muscle weakness (generalized): Secondary | ICD-10-CM | POA: Diagnosis not present

## 2017-06-07 DIAGNOSIS — M542 Cervicalgia: Secondary | ICD-10-CM | POA: Diagnosis not present

## 2017-06-07 DIAGNOSIS — M79602 Pain in left arm: Secondary | ICD-10-CM | POA: Diagnosis not present

## 2017-06-07 DIAGNOSIS — M5412 Radiculopathy, cervical region: Secondary | ICD-10-CM | POA: Diagnosis not present

## 2017-06-09 DIAGNOSIS — M79602 Pain in left arm: Secondary | ICD-10-CM | POA: Diagnosis not present

## 2017-06-09 DIAGNOSIS — M6281 Muscle weakness (generalized): Secondary | ICD-10-CM | POA: Diagnosis not present

## 2017-06-09 DIAGNOSIS — M542 Cervicalgia: Secondary | ICD-10-CM | POA: Diagnosis not present

## 2017-06-09 DIAGNOSIS — M5412 Radiculopathy, cervical region: Secondary | ICD-10-CM | POA: Diagnosis not present

## 2017-06-13 DIAGNOSIS — M542 Cervicalgia: Secondary | ICD-10-CM | POA: Diagnosis not present

## 2017-06-13 DIAGNOSIS — M6281 Muscle weakness (generalized): Secondary | ICD-10-CM | POA: Diagnosis not present

## 2017-06-13 DIAGNOSIS — M79602 Pain in left arm: Secondary | ICD-10-CM | POA: Diagnosis not present

## 2017-06-13 DIAGNOSIS — M5412 Radiculopathy, cervical region: Secondary | ICD-10-CM | POA: Diagnosis not present

## 2017-06-16 DIAGNOSIS — M6281 Muscle weakness (generalized): Secondary | ICD-10-CM | POA: Diagnosis not present

## 2017-06-16 DIAGNOSIS — M79602 Pain in left arm: Secondary | ICD-10-CM | POA: Diagnosis not present

## 2017-06-16 DIAGNOSIS — M542 Cervicalgia: Secondary | ICD-10-CM | POA: Diagnosis not present

## 2017-06-16 DIAGNOSIS — M5412 Radiculopathy, cervical region: Secondary | ICD-10-CM | POA: Diagnosis not present

## 2017-06-22 DIAGNOSIS — M79602 Pain in left arm: Secondary | ICD-10-CM | POA: Diagnosis not present

## 2017-06-22 DIAGNOSIS — M542 Cervicalgia: Secondary | ICD-10-CM | POA: Diagnosis not present

## 2017-06-22 DIAGNOSIS — M6281 Muscle weakness (generalized): Secondary | ICD-10-CM | POA: Diagnosis not present

## 2017-06-22 DIAGNOSIS — M5412 Radiculopathy, cervical region: Secondary | ICD-10-CM | POA: Diagnosis not present

## 2017-06-23 DIAGNOSIS — M79602 Pain in left arm: Secondary | ICD-10-CM | POA: Diagnosis not present

## 2017-06-23 DIAGNOSIS — M5412 Radiculopathy, cervical region: Secondary | ICD-10-CM | POA: Diagnosis not present

## 2017-06-23 DIAGNOSIS — M6281 Muscle weakness (generalized): Secondary | ICD-10-CM | POA: Diagnosis not present

## 2017-06-23 DIAGNOSIS — M542 Cervicalgia: Secondary | ICD-10-CM | POA: Diagnosis not present

## 2017-06-27 DIAGNOSIS — M5412 Radiculopathy, cervical region: Secondary | ICD-10-CM | POA: Diagnosis not present

## 2017-06-27 DIAGNOSIS — M6281 Muscle weakness (generalized): Secondary | ICD-10-CM | POA: Diagnosis not present

## 2017-06-27 DIAGNOSIS — M542 Cervicalgia: Secondary | ICD-10-CM | POA: Diagnosis not present

## 2017-06-27 DIAGNOSIS — M79602 Pain in left arm: Secondary | ICD-10-CM | POA: Diagnosis not present

## 2017-07-04 DIAGNOSIS — M542 Cervicalgia: Secondary | ICD-10-CM | POA: Diagnosis not present

## 2017-07-04 DIAGNOSIS — M6281 Muscle weakness (generalized): Secondary | ICD-10-CM | POA: Diagnosis not present

## 2017-07-04 DIAGNOSIS — M5412 Radiculopathy, cervical region: Secondary | ICD-10-CM | POA: Diagnosis not present

## 2017-07-04 DIAGNOSIS — M79602 Pain in left arm: Secondary | ICD-10-CM | POA: Diagnosis not present

## 2017-07-07 DIAGNOSIS — M79602 Pain in left arm: Secondary | ICD-10-CM | POA: Diagnosis not present

## 2017-07-07 DIAGNOSIS — M5412 Radiculopathy, cervical region: Secondary | ICD-10-CM | POA: Diagnosis not present

## 2017-07-07 DIAGNOSIS — M542 Cervicalgia: Secondary | ICD-10-CM | POA: Diagnosis not present

## 2017-07-07 DIAGNOSIS — M6281 Muscle weakness (generalized): Secondary | ICD-10-CM | POA: Diagnosis not present

## 2017-07-11 ENCOUNTER — Encounter: Payer: Self-pay | Admitting: Orthopedic Surgery

## 2017-07-11 ENCOUNTER — Ambulatory Visit (INDEPENDENT_AMBULATORY_CARE_PROVIDER_SITE_OTHER): Payer: Medicare Other | Admitting: Orthopedic Surgery

## 2017-07-11 VITALS — BP 151/100 | HR 49 | Ht 73.0 in | Wt 257.0 lb

## 2017-07-11 DIAGNOSIS — M6281 Muscle weakness (generalized): Secondary | ICD-10-CM | POA: Diagnosis not present

## 2017-07-11 DIAGNOSIS — M25512 Pain in left shoulder: Secondary | ICD-10-CM | POA: Diagnosis not present

## 2017-07-11 DIAGNOSIS — M25612 Stiffness of left shoulder, not elsewhere classified: Secondary | ICD-10-CM | POA: Diagnosis not present

## 2017-07-11 DIAGNOSIS — M7502 Adhesive capsulitis of left shoulder: Secondary | ICD-10-CM | POA: Diagnosis not present

## 2017-07-12 NOTE — Progress Notes (Signed)
Chief Complaint  Patient presents with  . Shoulder Pain    left  . Wrist Pain    left   67 year old male presents back in follow-up for adhesive capsulitis left shoulder painful loss of motion left hand and wrist.  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

## 2017-07-12 NOTE — Progress Notes (Signed)
ERROR

## 2017-07-15 DIAGNOSIS — M6281 Muscle weakness (generalized): Secondary | ICD-10-CM | POA: Diagnosis not present

## 2017-07-15 DIAGNOSIS — M7502 Adhesive capsulitis of left shoulder: Secondary | ICD-10-CM | POA: Diagnosis not present

## 2017-07-15 DIAGNOSIS — M25612 Stiffness of left shoulder, not elsewhere classified: Secondary | ICD-10-CM | POA: Diagnosis not present

## 2017-07-15 DIAGNOSIS — M25512 Pain in left shoulder: Secondary | ICD-10-CM | POA: Diagnosis not present

## 2017-07-18 DIAGNOSIS — M25512 Pain in left shoulder: Secondary | ICD-10-CM | POA: Diagnosis not present

## 2017-07-18 DIAGNOSIS — M25612 Stiffness of left shoulder, not elsewhere classified: Secondary | ICD-10-CM | POA: Diagnosis not present

## 2017-07-18 DIAGNOSIS — M7502 Adhesive capsulitis of left shoulder: Secondary | ICD-10-CM | POA: Diagnosis not present

## 2017-07-18 DIAGNOSIS — M6281 Muscle weakness (generalized): Secondary | ICD-10-CM | POA: Diagnosis not present

## 2017-07-21 DIAGNOSIS — M7502 Adhesive capsulitis of left shoulder: Secondary | ICD-10-CM | POA: Diagnosis not present

## 2017-07-21 DIAGNOSIS — M25512 Pain in left shoulder: Secondary | ICD-10-CM | POA: Diagnosis not present

## 2017-07-21 DIAGNOSIS — M25612 Stiffness of left shoulder, not elsewhere classified: Secondary | ICD-10-CM | POA: Diagnosis not present

## 2017-07-21 DIAGNOSIS — M6281 Muscle weakness (generalized): Secondary | ICD-10-CM | POA: Diagnosis not present

## 2017-07-25 DIAGNOSIS — M6281 Muscle weakness (generalized): Secondary | ICD-10-CM | POA: Diagnosis not present

## 2017-07-25 DIAGNOSIS — M7502 Adhesive capsulitis of left shoulder: Secondary | ICD-10-CM | POA: Diagnosis not present

## 2017-07-25 DIAGNOSIS — M25612 Stiffness of left shoulder, not elsewhere classified: Secondary | ICD-10-CM | POA: Diagnosis not present

## 2017-07-25 DIAGNOSIS — M25512 Pain in left shoulder: Secondary | ICD-10-CM | POA: Diagnosis not present

## 2017-07-29 DIAGNOSIS — M7502 Adhesive capsulitis of left shoulder: Secondary | ICD-10-CM | POA: Diagnosis not present

## 2017-07-29 DIAGNOSIS — M25612 Stiffness of left shoulder, not elsewhere classified: Secondary | ICD-10-CM | POA: Diagnosis not present

## 2017-07-29 DIAGNOSIS — M25512 Pain in left shoulder: Secondary | ICD-10-CM | POA: Diagnosis not present

## 2017-07-29 DIAGNOSIS — M6281 Muscle weakness (generalized): Secondary | ICD-10-CM | POA: Diagnosis not present

## 2017-08-01 DIAGNOSIS — M7502 Adhesive capsulitis of left shoulder: Secondary | ICD-10-CM | POA: Diagnosis not present

## 2017-08-01 DIAGNOSIS — M25512 Pain in left shoulder: Secondary | ICD-10-CM | POA: Diagnosis not present

## 2017-08-01 DIAGNOSIS — M6281 Muscle weakness (generalized): Secondary | ICD-10-CM | POA: Diagnosis not present

## 2017-08-01 DIAGNOSIS — M25612 Stiffness of left shoulder, not elsewhere classified: Secondary | ICD-10-CM | POA: Diagnosis not present

## 2017-08-04 DIAGNOSIS — M6281 Muscle weakness (generalized): Secondary | ICD-10-CM | POA: Diagnosis not present

## 2017-08-04 DIAGNOSIS — M25612 Stiffness of left shoulder, not elsewhere classified: Secondary | ICD-10-CM | POA: Diagnosis not present

## 2017-08-04 DIAGNOSIS — M25512 Pain in left shoulder: Secondary | ICD-10-CM | POA: Diagnosis not present

## 2017-08-04 DIAGNOSIS — M7502 Adhesive capsulitis of left shoulder: Secondary | ICD-10-CM | POA: Diagnosis not present

## 2017-08-08 DIAGNOSIS — M25512 Pain in left shoulder: Secondary | ICD-10-CM | POA: Diagnosis not present

## 2017-08-08 DIAGNOSIS — M6281 Muscle weakness (generalized): Secondary | ICD-10-CM | POA: Diagnosis not present

## 2017-08-08 DIAGNOSIS — M25612 Stiffness of left shoulder, not elsewhere classified: Secondary | ICD-10-CM | POA: Diagnosis not present

## 2017-08-08 DIAGNOSIS — M7502 Adhesive capsulitis of left shoulder: Secondary | ICD-10-CM | POA: Diagnosis not present

## 2017-08-09 DIAGNOSIS — Z0000 Encounter for general adult medical examination without abnormal findings: Secondary | ICD-10-CM | POA: Diagnosis not present

## 2017-08-09 DIAGNOSIS — Z1389 Encounter for screening for other disorder: Secondary | ICD-10-CM | POA: Diagnosis not present

## 2017-08-09 DIAGNOSIS — E119 Type 2 diabetes mellitus without complications: Secondary | ICD-10-CM | POA: Diagnosis not present

## 2017-08-09 DIAGNOSIS — I10 Essential (primary) hypertension: Secondary | ICD-10-CM | POA: Diagnosis not present

## 2017-08-09 DIAGNOSIS — R7309 Other abnormal glucose: Secondary | ICD-10-CM | POA: Diagnosis not present

## 2017-08-09 DIAGNOSIS — Z719 Counseling, unspecified: Secondary | ICD-10-CM | POA: Diagnosis not present

## 2017-08-11 DIAGNOSIS — Z0000 Encounter for general adult medical examination without abnormal findings: Secondary | ICD-10-CM | POA: Diagnosis not present

## 2017-08-11 DIAGNOSIS — Z1389 Encounter for screening for other disorder: Secondary | ICD-10-CM | POA: Diagnosis not present

## 2017-08-11 DIAGNOSIS — E782 Mixed hyperlipidemia: Secondary | ICD-10-CM | POA: Diagnosis not present

## 2017-08-12 DIAGNOSIS — M25612 Stiffness of left shoulder, not elsewhere classified: Secondary | ICD-10-CM | POA: Diagnosis not present

## 2017-08-12 DIAGNOSIS — M7502 Adhesive capsulitis of left shoulder: Secondary | ICD-10-CM | POA: Diagnosis not present

## 2017-08-12 DIAGNOSIS — M25512 Pain in left shoulder: Secondary | ICD-10-CM | POA: Diagnosis not present

## 2017-08-12 DIAGNOSIS — M6281 Muscle weakness (generalized): Secondary | ICD-10-CM | POA: Diagnosis not present

## 2017-08-18 ENCOUNTER — Other Ambulatory Visit (HOSPITAL_COMMUNITY): Payer: Self-pay | Admitting: Family Medicine

## 2017-08-18 DIAGNOSIS — Z1389 Encounter for screening for other disorder: Secondary | ICD-10-CM

## 2017-08-30 ENCOUNTER — Ambulatory Visit (HOSPITAL_COMMUNITY)
Admission: RE | Admit: 2017-08-30 | Discharge: 2017-08-30 | Disposition: A | Discharge status: Home / Self Care | Payer: Medicare Other | Source: Ambulatory Visit | Attending: Family Medicine | Admitting: Family Medicine

## 2017-08-30 DIAGNOSIS — Z87891 Personal history of nicotine dependence: Secondary | ICD-10-CM | POA: Diagnosis not present

## 2017-08-30 DIAGNOSIS — Z136 Encounter for screening for cardiovascular disorders: Secondary | ICD-10-CM | POA: Diagnosis not present

## 2017-08-30 DIAGNOSIS — Z1389 Encounter for screening for other disorder: Secondary | ICD-10-CM | POA: Diagnosis not present

## 2017-08-30 DIAGNOSIS — I77811 Abdominal aortic ectasia: Secondary | ICD-10-CM | POA: Insufficient documentation

## 2017-08-30 DIAGNOSIS — Z363 Encounter for antenatal screening for malformations: Secondary | ICD-10-CM

## 2017-10-10 ENCOUNTER — Ambulatory Visit: Payer: Self-pay | Admitting: Orthopedic Surgery

## 2017-10-24 ENCOUNTER — Encounter: Payer: Self-pay | Admitting: Orthopedic Surgery

## 2017-10-24 ENCOUNTER — Ambulatory Visit: Payer: Medicare Other | Admitting: Orthopedic Surgery

## 2017-10-24 NOTE — Progress Notes (Deleted)
Progress Note   Patient ID: ANTWOIN LACKEY, male   DOB: [birth year], 67 y.o.   MRN: [id number]  No chief complaint on file.    Medical decision-making No diagnosis found.    No orders of the defined types were placed in this encounter.    PLAN: ***    No chief complaint on file.   HPI   ROS No outpatient medications have been marked as taking for the 10/24/17 encounter (Appointment) with Carole Civil, MD.    No Known Allergies   There were no vitals taken for this visit.  Physical Exam     Arther Abbott, MD 10/24/2017 10:28 AM

## 2018-04-28 ENCOUNTER — Ambulatory Visit (HOSPITAL_COMMUNITY)
Admission: RE | Admit: 2018-04-28 | Discharge: 2018-04-28 | Disposition: A | Discharge status: Home / Self Care | Payer: Medicare Other | Source: Ambulatory Visit | Attending: Family Medicine | Admitting: Family Medicine

## 2018-04-28 ENCOUNTER — Other Ambulatory Visit (HOSPITAL_COMMUNITY): Payer: Self-pay | Admitting: Family Medicine

## 2018-04-28 DIAGNOSIS — E119 Type 2 diabetes mellitus without complications: Secondary | ICD-10-CM | POA: Diagnosis not present

## 2018-04-28 DIAGNOSIS — Z719 Counseling, unspecified: Secondary | ICD-10-CM | POA: Diagnosis not present

## 2018-04-28 DIAGNOSIS — J069 Acute upper respiratory infection, unspecified: Secondary | ICD-10-CM

## 2018-04-28 DIAGNOSIS — E1129 Type 2 diabetes mellitus with other diabetic kidney complication: Secondary | ICD-10-CM | POA: Diagnosis not present

## 2018-04-28 DIAGNOSIS — R05 Cough: Secondary | ICD-10-CM | POA: Diagnosis not present

## 2018-04-28 DIAGNOSIS — Z1389 Encounter for screening for other disorder: Secondary | ICD-10-CM | POA: Diagnosis not present

## 2018-04-28 DIAGNOSIS — E1140 Type 2 diabetes mellitus with diabetic neuropathy, unspecified: Secondary | ICD-10-CM | POA: Diagnosis not present

## 2018-06-16 DIAGNOSIS — Z23 Encounter for immunization: Secondary | ICD-10-CM | POA: Diagnosis not present

## 2018-07-25 DIAGNOSIS — E1129 Type 2 diabetes mellitus with other diabetic kidney complication: Secondary | ICD-10-CM | POA: Diagnosis not present

## 2018-07-25 DIAGNOSIS — Z1389 Encounter for screening for other disorder: Secondary | ICD-10-CM | POA: Diagnosis not present

## 2018-07-25 DIAGNOSIS — Z0000 Encounter for general adult medical examination without abnormal findings: Secondary | ICD-10-CM | POA: Diagnosis not present

## 2018-07-25 DIAGNOSIS — G4700 Insomnia, unspecified: Secondary | ICD-10-CM | POA: Diagnosis not present

## 2018-08-29 ENCOUNTER — Ambulatory Visit: Payer: Medicare Other | Admitting: Podiatry

## 2018-08-29 ENCOUNTER — Encounter: Payer: Self-pay | Admitting: Podiatry

## 2018-08-29 VITALS — BP 161/93 | HR 58

## 2018-08-29 DIAGNOSIS — M79674 Pain in right toe(s): Secondary | ICD-10-CM | POA: Diagnosis not present

## 2018-08-29 DIAGNOSIS — B351 Tinea unguium: Secondary | ICD-10-CM | POA: Diagnosis not present

## 2018-08-29 DIAGNOSIS — M79675 Pain in left toe(s): Secondary | ICD-10-CM | POA: Diagnosis not present

## 2018-08-29 DIAGNOSIS — E119 Type 2 diabetes mellitus without complications: Secondary | ICD-10-CM

## 2018-08-29 NOTE — Progress Notes (Signed)
This patient presents to the office with chief complaint of long thick nails and diabetic feet.  This patient  says there  is  no pain and discomfort in his  feet.  This patient says there are long thick painful nails.  These nails are painful walking and wearing shoes.  Patient has no history of infection or drainage from both feet.  Patient is unable to  self treat his own nails . This patient presents  to the office today for treatment of the  long nails and a foot evaluation due to history of  diabetes.  General Appearance  Alert, conversant and in no acute stress.  Vascular  Dorsalis pedis and posterior tibial  pulses are palpable  bilaterally.  Capillary return is within normal limits  bilaterally. Temperature is within normal limits  bilaterally.  Neurologic  Senn-Weinstein monofilament wire test within normal limits  bilaterally. Muscle power within normal limits bilaterally.  Nails Thick disfigured discolored nails with subungual debris  from hallux to fifth toes bilaterally. No evidence of bacterial infection or drainage bilaterally.  Orthopedic  No limitations of motion of motion feet .  No crepitus or effusions noted.  No bony pathology or digital deformities noted.  Skin  normotropic skin with no porokeratosis noted bilaterally.  No signs of infections or ulcers noted.     Onychomycosis  Diabetes with no foot complications  IE  Debride nails x 10.  A diabetic foot exam was performed and there is no evidence of any vascular or neurologic pathology.   RTC 3 months.   Kenneth Lax DPM  

## 2018-10-23 IMAGING — DX DG HAND COMPLETE 3+V*L*
3 series · 3 of 3 positions shown · non-contrast
Comparison: Plain films left hand 08/25/2006.

CLINICAL DATA: Left wrist and hand pain for 1 month. No known
injury.

EXAM:
LEFT HAND - COMPLETE 3+ VIEW

[hand pa]
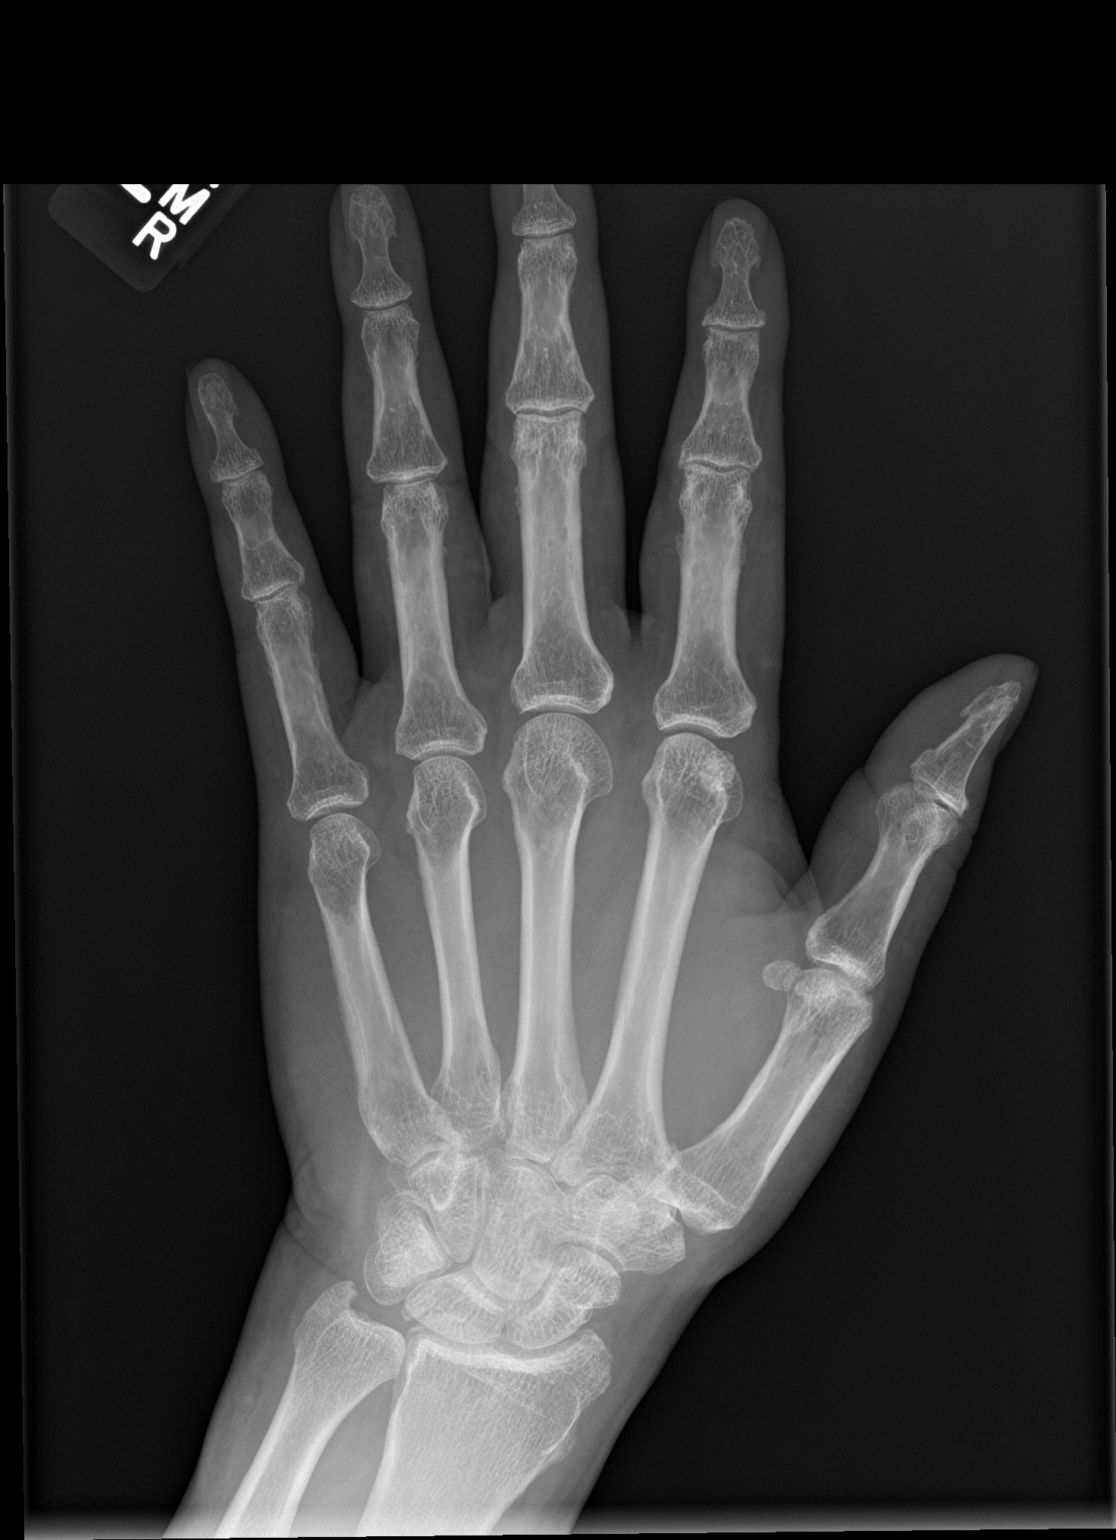

[hand obl]
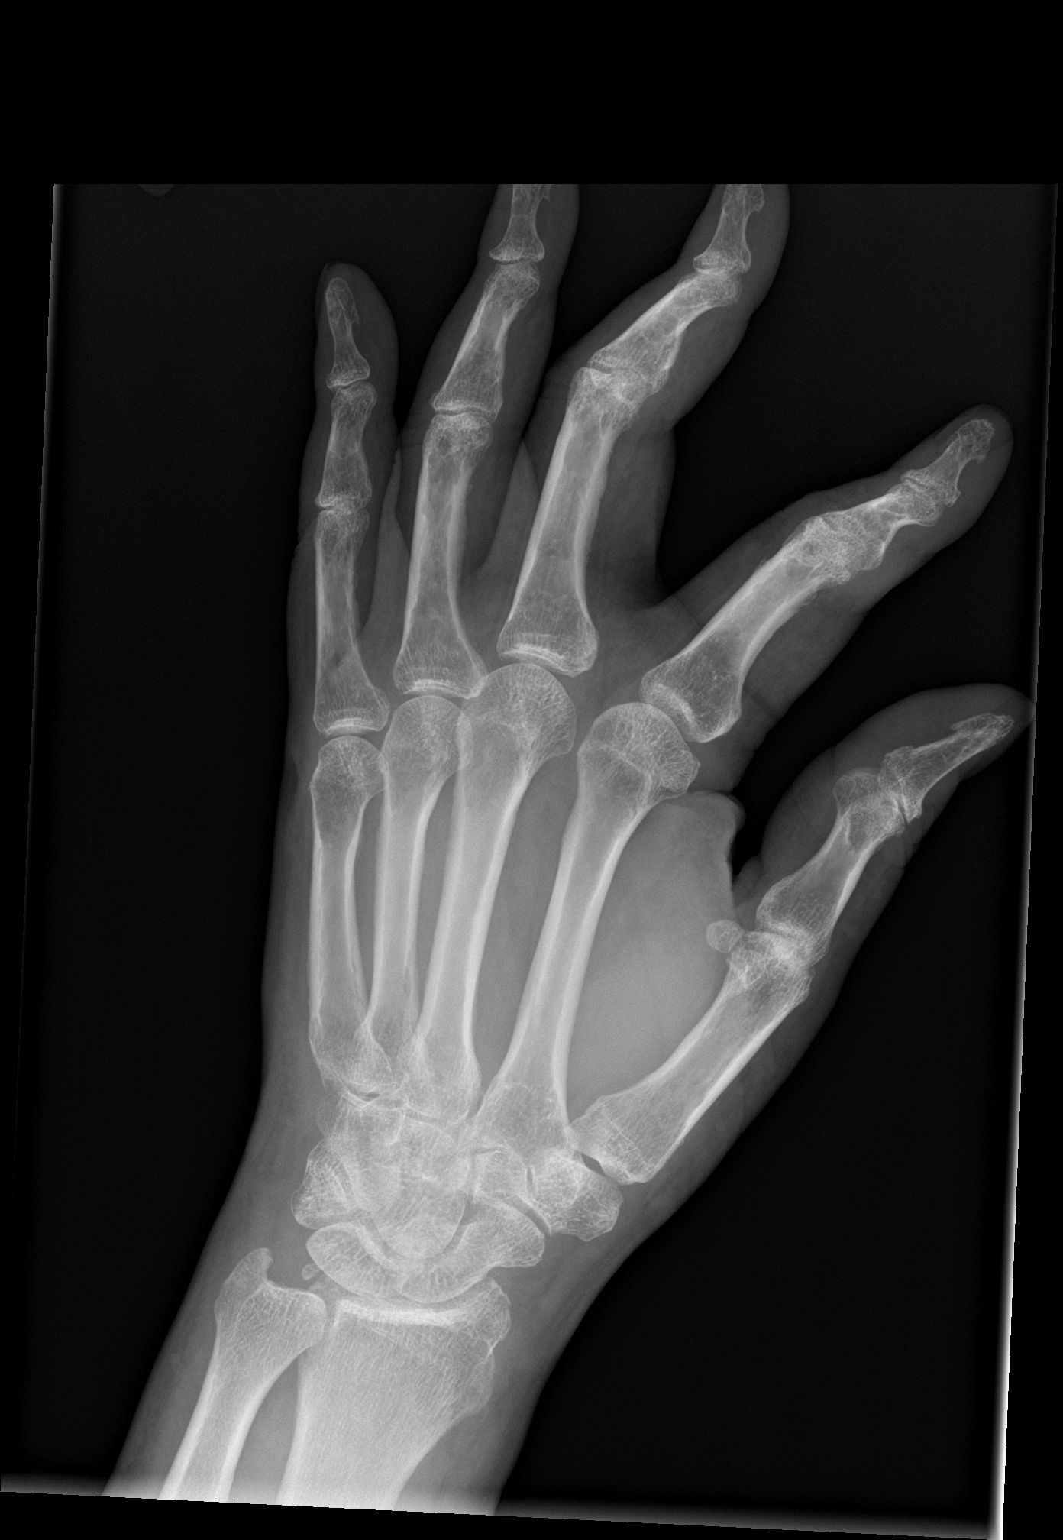

[hand lat]
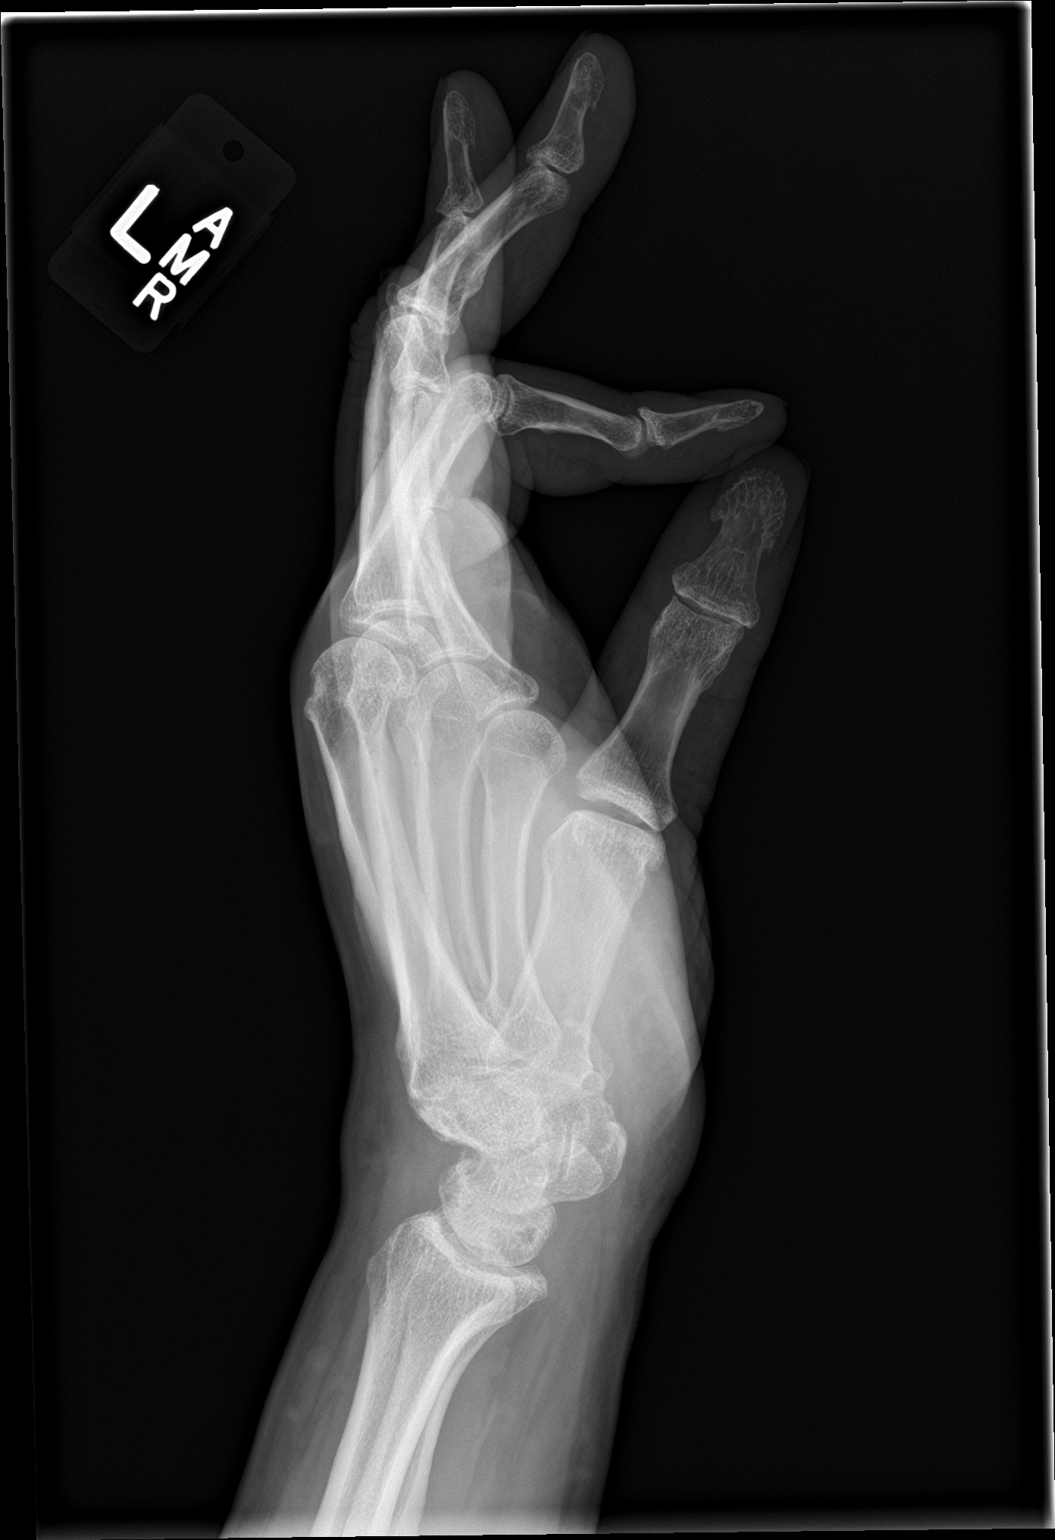

[3 of 3 positions shown; findings below may reference images not displayed]

FINDINGS: There is no acute bony or joint abnormality. Mild osteoarthritis is
present about the IP joint of the thumb and DIP joints of the
fingers. Mild radiocarpal degenerative change is also seen. No
erosion or periostitis. Mineralization and alignment are normal.
Soft tissues appear normal.
IMPRESSION: No acute or focal abnormality.

Scattered mild osteoarthritis.

## 2018-10-23 IMAGING — DX DG WRIST COMPLETE 3+V*L*
4 series · 4 of 4 positions shown · non-contrast
Comparison: Plain films of the left hand 08/22/2016.

CLINICAL DATA: Left wrist and hand pain for 1 month. No known
injury.

EXAM:
LEFT WRIST - COMPLETE 3+ VIEW

[wrist pa]
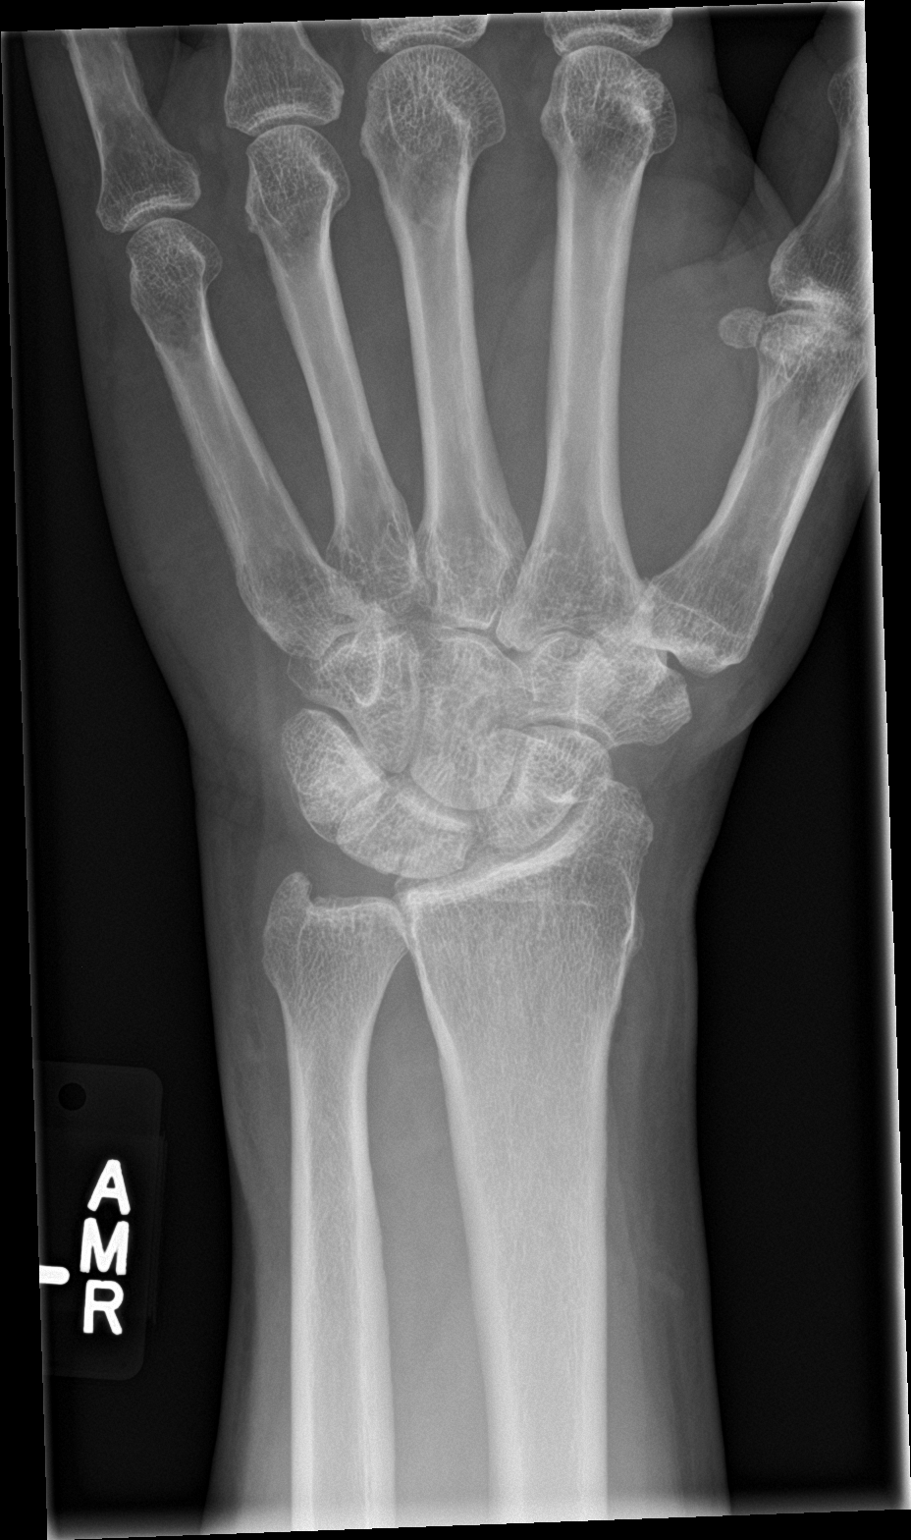

[wrist obl]
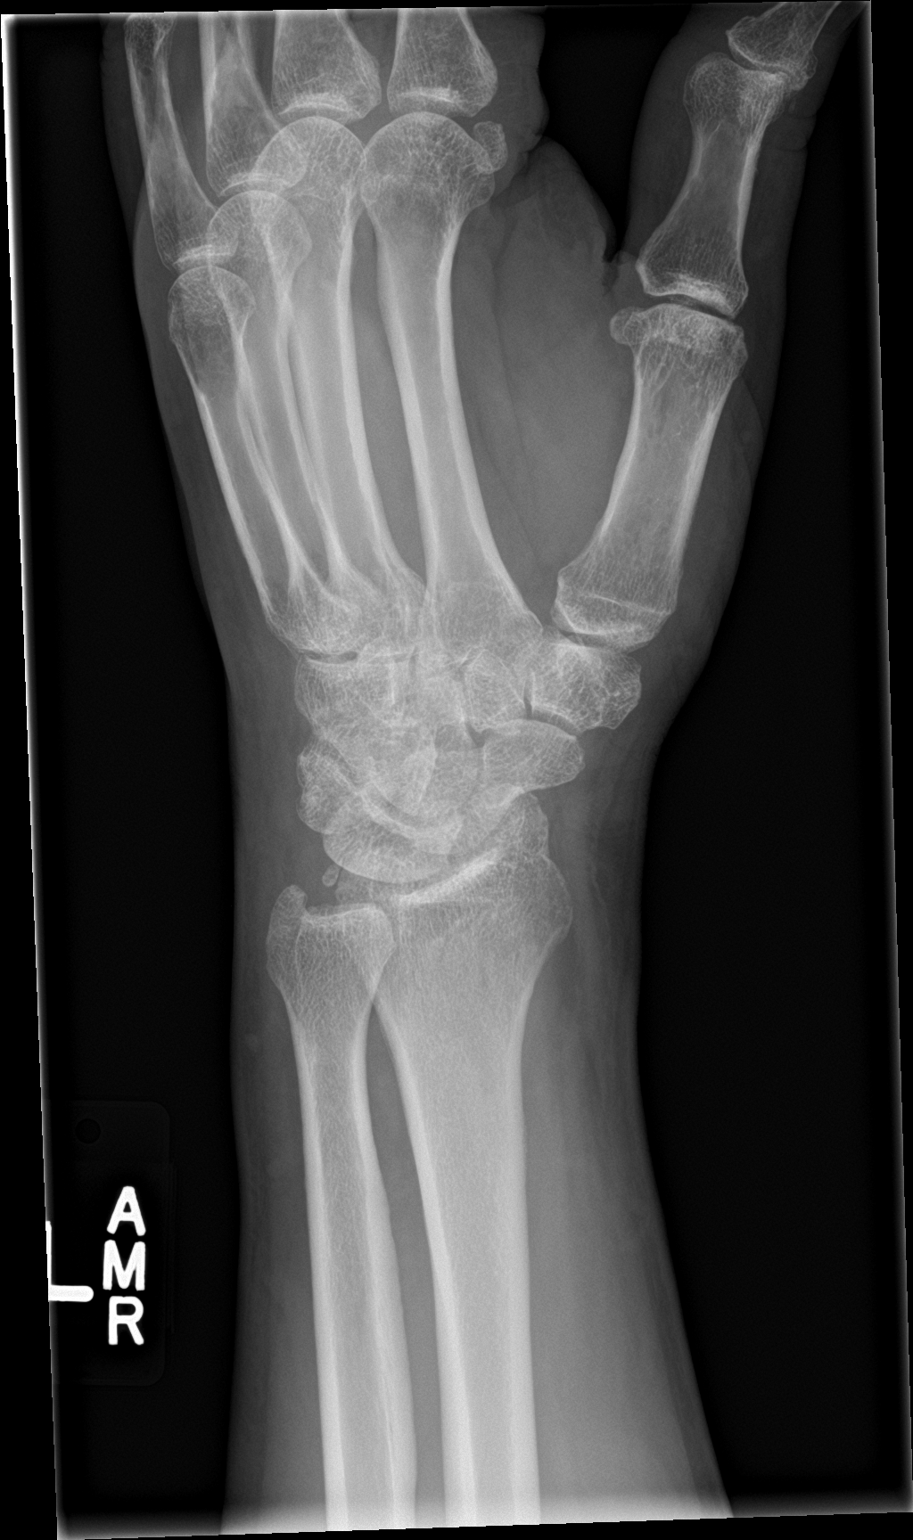

[wrist lat]
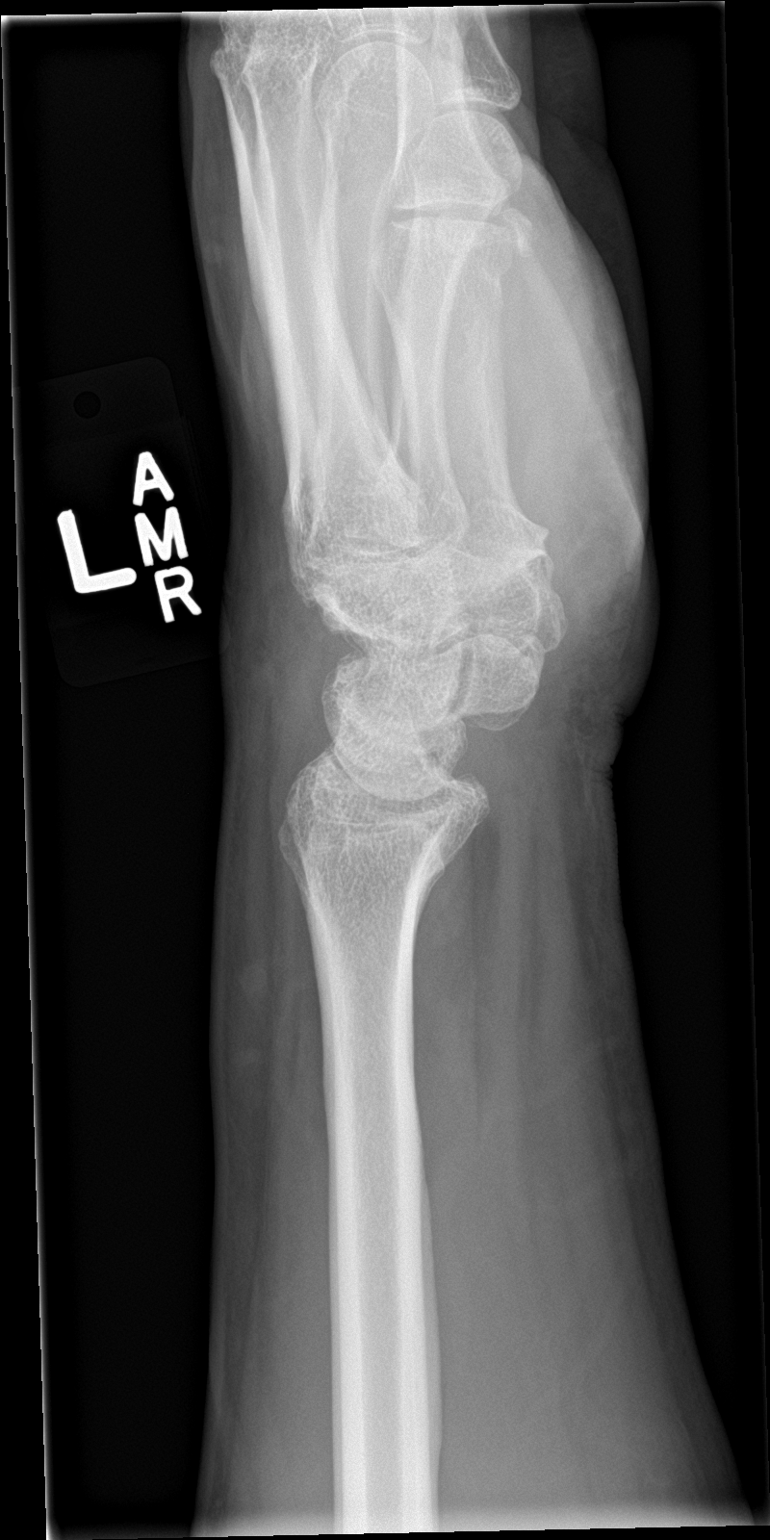

[wrist navicular]
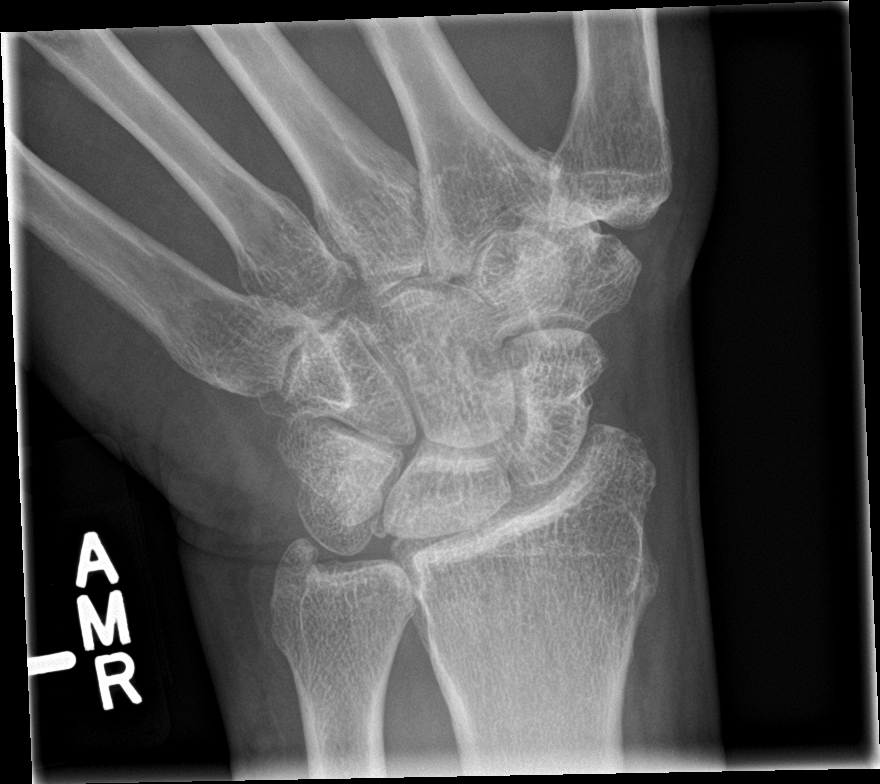

[4 of 4 positions shown; findings below may reference images not displayed]

FINDINGS: There is no acute bony or joint abnormality. Mild degenerative
change about the carpometacarpal joints is noted. Soft tissues
appear normal.
IMPRESSION: No acute or focal abnormality.

Mild degenerative change about the carpometacarpal joints.

## 2018-11-29 ENCOUNTER — Other Ambulatory Visit: Payer: Self-pay

## 2018-11-29 ENCOUNTER — Encounter: Payer: Self-pay | Admitting: Podiatry

## 2018-11-29 ENCOUNTER — Ambulatory Visit: Payer: Medicare Other | Admitting: Podiatry

## 2018-11-29 VITALS — Temp 97.3°F

## 2018-11-29 DIAGNOSIS — M79674 Pain in right toe(s): Secondary | ICD-10-CM | POA: Diagnosis not present

## 2018-11-29 DIAGNOSIS — M79675 Pain in left toe(s): Secondary | ICD-10-CM | POA: Diagnosis not present

## 2018-11-29 DIAGNOSIS — B351 Tinea unguium: Secondary | ICD-10-CM

## 2018-11-29 DIAGNOSIS — E119 Type 2 diabetes mellitus without complications: Secondary | ICD-10-CM

## 2018-11-29 NOTE — Progress Notes (Signed)
Complaint:  Visit Type: Patient returns to my office for continued preventative foot care services. Complaint: Patient states" my nails have grown long and thick and become painful to walk and wear shoes" Patient has been diagnosed with DM with no foot complications. The patient presents for preventative foot care services. No changes to ROS  Podiatric Exam: Vascular: dorsalis pedis and posterior tibial pulses are palpable bilateral. Capillary return is immediate. Temperature gradient is WNL. Skin turgor WNL  Sensorium: Normal Semmes Weinstein monofilament test. Normal tactile sensation bilaterally. Nail Exam: Pt has thick disfigured discolored nails with subungual debris noted bilateral entire nail hallux through fifth toenails Ulcer Exam: There is no evidence of ulcer or pre-ulcerative changes or infection. Orthopedic Exam: Muscle tone and strength are WNL. No limitations in general ROM. No crepitus or effusions noted. Foot type and digits show no abnormalities. Bony prominences are unremarkable. Skin: No Porokeratosis. No infection or ulcers  Diagnosis:  Onychomycosis, , Pain in right toe, pain in left toes  Treatment & Plan Procedures and Treatment: Consent by patient was obtained for treatment procedures.   Debridement of mycotic and hypertrophic toenails, 1 through 5 bilateral and clearing of subungual debris. No ulceration, no infection noted.  Return Visit-Office Procedure: Patient instructed to return to the office for a follow up visit 10 weeks  for continued evaluation and treatment.    Shirah Roseman DPM 

## 2018-11-30 DIAGNOSIS — Z23 Encounter for immunization: Secondary | ICD-10-CM | POA: Diagnosis not present

## 2018-11-30 DIAGNOSIS — I10 Essential (primary) hypertension: Secondary | ICD-10-CM | POA: Diagnosis not present

## 2018-11-30 DIAGNOSIS — E1165 Type 2 diabetes mellitus with hyperglycemia: Secondary | ICD-10-CM | POA: Diagnosis not present

## 2018-11-30 DIAGNOSIS — R39198 Other difficulties with micturition: Secondary | ICD-10-CM | POA: Diagnosis not present

## 2018-11-30 DIAGNOSIS — E119 Type 2 diabetes mellitus without complications: Secondary | ICD-10-CM | POA: Diagnosis not present

## 2019-02-07 ENCOUNTER — Ambulatory Visit: Payer: Medicare Other | Admitting: Podiatry

## 2019-02-07 ENCOUNTER — Encounter: Payer: Self-pay | Admitting: Podiatry

## 2019-02-07 ENCOUNTER — Other Ambulatory Visit: Payer: Self-pay

## 2019-02-07 VITALS — Temp 97.6°F

## 2019-02-07 DIAGNOSIS — M79675 Pain in left toe(s): Secondary | ICD-10-CM | POA: Diagnosis not present

## 2019-02-07 DIAGNOSIS — B351 Tinea unguium: Secondary | ICD-10-CM

## 2019-02-07 DIAGNOSIS — E119 Type 2 diabetes mellitus without complications: Secondary | ICD-10-CM

## 2019-02-07 DIAGNOSIS — M79674 Pain in right toe(s): Secondary | ICD-10-CM | POA: Diagnosis not present

## 2019-02-07 NOTE — Progress Notes (Signed)
Complaint:  Visit Type: Patient returns to my office for continued preventative foot care services. Complaint: Patient states" my nails have grown long and thick and become painful to walk and wear shoes" Patient has been diagnosed with DM with no foot complications. The patient presents for preventative foot care services. No changes to ROS  Podiatric Exam: Vascular: dorsalis pedis and posterior tibial pulses are palpable bilateral. Capillary return is immediate. Temperature gradient is WNL. Skin turgor WNL  Sensorium: Normal Semmes Weinstein monofilament test. Normal tactile sensation bilaterally. Nail Exam: Pt has thick disfigured discolored nails with subungual debris noted bilateral entire nail hallux through fifth toenails Ulcer Exam: There is no evidence of ulcer or pre-ulcerative changes or infection. Orthopedic Exam: Muscle tone and strength are WNL. No limitations in general ROM. No crepitus or effusions noted. Foot type and digits show no abnormalities. Bony prominences are unremarkable. Skin: No Porokeratosis. No infection or ulcers  Diagnosis:  Onychomycosis, , Pain in right toe, pain in left toes  Treatment & Plan Procedures and Treatment: Consent by patient was obtained for treatment procedures.   Debridement of mycotic and hypertrophic toenails, 1 through 5 bilateral and clearing of subungual debris. No ulceration, no infection noted.  Return Visit-Office Procedure: Patient instructed to return to the office for a follow up visit 10 weeks  for continued evaluation and treatment.    Crystale Giannattasio DPM 

## 2019-04-17 ENCOUNTER — Ambulatory Visit: Payer: Medicare Other | Admitting: Podiatry

## 2019-04-27 DIAGNOSIS — Z7984 Long term (current) use of oral hypoglycemic drugs: Secondary | ICD-10-CM | POA: Diagnosis not present

## 2019-04-27 DIAGNOSIS — Z79899 Other long term (current) drug therapy: Secondary | ICD-10-CM | POA: Diagnosis not present

## 2019-04-27 DIAGNOSIS — E1122 Type 2 diabetes mellitus with diabetic chronic kidney disease: Secondary | ICD-10-CM | POA: Diagnosis not present

## 2019-04-27 DIAGNOSIS — I129 Hypertensive chronic kidney disease with stage 1 through stage 4 chronic kidney disease, or unspecified chronic kidney disease: Secondary | ICD-10-CM | POA: Diagnosis not present

## 2019-05-01 DIAGNOSIS — I10 Essential (primary) hypertension: Secondary | ICD-10-CM | POA: Diagnosis not present

## 2019-05-01 DIAGNOSIS — Z23 Encounter for immunization: Secondary | ICD-10-CM | POA: Diagnosis not present

## 2019-05-01 DIAGNOSIS — E1165 Type 2 diabetes mellitus with hyperglycemia: Secondary | ICD-10-CM | POA: Diagnosis not present

## 2019-05-16 ENCOUNTER — Encounter: Payer: Self-pay | Admitting: Podiatry

## 2019-05-16 ENCOUNTER — Other Ambulatory Visit: Payer: Self-pay

## 2019-05-16 ENCOUNTER — Ambulatory Visit: Payer: Medicare Other | Admitting: Podiatry

## 2019-05-16 DIAGNOSIS — B351 Tinea unguium: Secondary | ICD-10-CM

## 2019-05-16 DIAGNOSIS — E119 Type 2 diabetes mellitus without complications: Secondary | ICD-10-CM

## 2019-05-16 DIAGNOSIS — M79674 Pain in right toe(s): Secondary | ICD-10-CM

## 2019-05-16 DIAGNOSIS — M79675 Pain in left toe(s): Secondary | ICD-10-CM | POA: Diagnosis not present

## 2019-05-16 NOTE — Progress Notes (Signed)
Complaint:  Visit Type: Patient returns to my office for continued preventative foot care services. Complaint: Patient states" my nails have grown long and thick and become painful to walk and wear shoes" Patient has been diagnosed with DM with no foot complications. The patient presents for preventative foot care services. No changes to ROS  Podiatric Exam: Vascular: dorsalis pedis and posterior tibial pulses are palpable bilateral. Capillary return is immediate. Temperature gradient is WNL. Skin turgor WNL  Sensorium: Normal Semmes Weinstein monofilament test. Normal tactile sensation bilaterally. Nail Exam: Pt has thick disfigured discolored nails with subungual debris noted bilateral entire nail hallux through fifth toenails Ulcer Exam: There is no evidence of ulcer or pre-ulcerative changes or infection. Orthopedic Exam: Muscle tone and strength are WNL. No limitations in general ROM. No crepitus or effusions noted. Foot type and digits show no abnormalities. Bony prominences are unremarkable. Skin: No Porokeratosis. No infection or ulcers  Diagnosis:  Onychomycosis, , Pain in right toe, pain in left toes  Treatment & Plan Procedures and Treatment: Consent by patient was obtained for treatment procedures.   Debridement of mycotic and hypertrophic toenails, 1 through 5 bilateral and clearing of subungual debris. No ulceration, no infection noted.  Return Visit-Office Procedure: Patient instructed to return to the office for a follow up visit 10 weeks  for continued evaluation and treatment.    Walda Hertzog DPM 

## 2019-05-22 ENCOUNTER — Other Ambulatory Visit: Payer: Self-pay | Admitting: *Deleted

## 2019-05-22 ENCOUNTER — Encounter: Payer: Self-pay | Admitting: *Deleted

## 2019-05-22 NOTE — Patient Outreach (Signed)
[facility]) Care Management  05/22/2019  Alex Morton [birth year] [id number]  Saw patient in PCP's office on 11/23/202 for retinal screening.

## 2019-05-28 DIAGNOSIS — I129 Hypertensive chronic kidney disease with stage 1 through stage 4 chronic kidney disease, or unspecified chronic kidney disease: Secondary | ICD-10-CM | POA: Diagnosis not present

## 2019-05-28 DIAGNOSIS — E1122 Type 2 diabetes mellitus with diabetic chronic kidney disease: Secondary | ICD-10-CM | POA: Diagnosis not present

## 2019-05-28 DIAGNOSIS — N183 Chronic kidney disease, stage 3 unspecified: Secondary | ICD-10-CM | POA: Diagnosis not present

## 2019-06-05 ENCOUNTER — Other Ambulatory Visit: Payer: Self-pay | Admitting: *Deleted

## 2019-06-05 NOTE — Patient Outreach (Addendum)
[facility]) Care Management  06/05/2019  MAYA ORIA 22-Feb-1951 [id number]    Referral received: 05/23/19 Initial outreach 06/05/2019  Telephone Assessment-Successful-Enrolled (Diabetes) Eye Screen follow up for [facility] service  RN spoke with pt today and verified he underwent the scheduled eye exam on 05/22/2019. Pt reports he is awaiting the result for possible interventions. RN further inquired on his knowledge related to his diabetes. Pt state minimal although he has had diabetes over 8 years. Pt aware of dietary foods with information he has been provider but states "it's hard" to reduce his carbohydrates. Reports his history of A1c was at 15 but recently in Nov reports it's at an 11. RN educated pt on the normal readings for an A1c and offered to assist pt further with enrollment into the HTN program to assist pt with managing his diabetes. Pt receptive as a plan of care was generated related to knowledge deficit, reducing his A1c over the next few months and dietary habits. Will mail information on EMMI related to diabetes. Inquired on pt's recent BS however pt reports he has not perform a glucose check this morning. States his provider's office is suppose to request a new meter for his ongoing glucose checks. Pt states he would contact Dr. Gerarda Fraction office and follow up on this request.  RN again stress the importance of daily CBG in managing his diabetes and will send a [facility] calendar for daily documentation of his CBG (pt receptive).  Encouraged pt to follow up today for the new glucose.  RN will send a message to Dr. Gerarda Fraction on the requested glucose meter for this pt.  Brief discussion on dietary food to avoid, reduce and to further engage with high protein and low carb dietary habits to control his elevated readings and overall A1c (pt receptive).  Goals of care discussed and provider's office will be notified of pt's disposition with [facility] services . RN explained the need for  an initial assessment and offered to complete this today or if pt would like another day to complete. Pt requested next week for an early morning appointments. Will scheduled and follow up accordingly.   THN CM Care Plan Problem One     Most Recent Value  Care Plan Problem One  Diabetes Knowledge Related to Diabetes Mangement (unfamiliarity with information)  Role Documenting the Problem One  Care Management Telephonic Coordinator  Care Plan for Problem One  Active  THN Long Term Goal   Pt will verbalize Hgb A1c reduction by 1-2 points within the next 90 days.  THN Long Term Goal Start Date  06/05/19  Interventions for Problem One Long Term Goal  Will educate on the improtance of reducing the A1C to developl preventable complications. Will discuss the risk and send printable material for pt to review.  THN CM Short Term Goal #1   Pt will mointor his blood glucose and keep provided [facility] journal (via calendar) with the next 30 days.  THN CM Short Term Goal #1 Start Date  06/05/19  Interventions for Short Term Goal #1  Will discuss the importance of daily glucose checks and when to seek medical attention with abnormal readings. Will sent [facility] calender for pt to document daily readings for his provider to view for possible interventions as pt manages his diabetes.  THN CM Short Term Goal #2   Pt will identify two food to avoid in a diabetic diet within the next 30 days.  THN CM Short Term Goal #2 Start  Date  06/05/19  Interventions for Short Term Goal #2  Discussed food items to avoid and reduce that elevate his BS. Will provider printable information and request pt review for ongoing knowledge based in managing his diabetes.      Raina Mina, RN Care Management Coordinator [place] Office [phone]

## 2019-06-11 ENCOUNTER — Other Ambulatory Visit: Payer: Self-pay | Admitting: *Deleted

## 2019-06-11 DIAGNOSIS — E785 Hyperlipidemia, unspecified: Secondary | ICD-10-CM

## 2019-06-11 DIAGNOSIS — I10 Essential (primary) hypertension: Secondary | ICD-10-CM

## 2019-06-11 DIAGNOSIS — E119 Type 2 diabetes mellitus without complications: Secondary | ICD-10-CM | POA: Insufficient documentation

## 2019-06-11 NOTE — Patient Outreach (Signed)
[facility]) Care Management  06/11/2019  Alex Morton 1950/10/24 [id number]    Telephone Assessment (Initial Assessment Completion)  RN spoke with pt today to completed the initial assessment only and address any other issues not discussed on the initial call last week. Note plan of care generated and in place with goals. Provider has also been updated on pt's disposition with [facility] services.   Plan: RN will follow up next month as requested for ongoing disease management services.  Raina Mina, RN Care Management Coordinator [place] Office [phone]

## 2019-06-18 ENCOUNTER — Encounter: Payer: Self-pay | Admitting: *Deleted

## 2019-06-28 DIAGNOSIS — Z79899 Other long term (current) drug therapy: Secondary | ICD-10-CM | POA: Diagnosis not present

## 2019-06-28 DIAGNOSIS — I129 Hypertensive chronic kidney disease with stage 1 through stage 4 chronic kidney disease, or unspecified chronic kidney disease: Secondary | ICD-10-CM | POA: Diagnosis not present

## 2019-06-28 DIAGNOSIS — Z7984 Long term (current) use of oral hypoglycemic drugs: Secondary | ICD-10-CM | POA: Diagnosis not present

## 2019-06-28 DIAGNOSIS — E1122 Type 2 diabetes mellitus with diabetic chronic kidney disease: Secondary | ICD-10-CM | POA: Diagnosis not present

## 2019-07-12 ENCOUNTER — Other Ambulatory Visit: Payer: Self-pay | Admitting: *Deleted

## 2019-07-12 ENCOUNTER — Encounter: Payer: Self-pay | Admitting: *Deleted

## 2019-07-12 NOTE — Patient Outreach (Signed)
[facility]) Care Management  07/12/2019  CHAPMAN MATTEUCCI 02-01-1951 [id number]   Telephone Assessment-Diabetes  RN spoke with pt today and inquired on his ongoing management of care. Pt reports his recent BS ranging from 138-150 however much improvement. States he is watching his dietary intake with low carbs but still feels hungry. RN discussed several small meals throughout the day (4-6) along with healthy snacks to fill the void. In additional to hydration. Pt will attempt to increase his small meals as discussed today. RN also discussed the current plan of care as pt continue to await an update on his A1c. Other goals met and intervention noted accordingly on existing plan of care goals. Pt inquired on his recent eye exam and states he has yet to receive the results. RN offered to e-mail the coordinator [facility]) since pt was informed [facility] was able to obtain his results however also encouraged pt to reach out to his provider if he is experiencing any related symptoms. Pt verbalized an understanding and will contact his provider with the request for an eye specialist.  RN further inquired on pt's dietary habits with discussion on good and bad food items. Pt able to discussed his changes from fried to broiled and grilled meats. RN strongly encourage pt to continue with the good dietary habits while managing his diabetes. Care plan updated accordingly.  THN CM Care Plan Problem One     Most Recent Value  Care Plan Problem One  Diabetes Knowledge Related to Diabetes Mangement (unfamiliarity with information)  Role Documenting the Problem One  Care Management Telephonic Coordinator  Care Plan for Problem One  Active  THN Long Term Goal   Pt will verbalize Hgb A1c reduction by 1-2 points within the next 90 days.  THN Long Term Goal Start Date  06/05/19  Interventions for Problem One Long Term Goal  Will continue to educate on the importance of monitoring his diabetes  through his daily activities and dietary measures. Will continue to extend this goal and re-evaluate accordingly.   THN CM Short Term Goal #1   Pt will mointor his blood glucose and keep provided [facility] journal (via calendar) with the next 30 days.  THN CM Short Term Goal #1 Start Date  06/05/19  THN CM Short Term Goal #1 Met Date  07/12/19  THN CM Short Term Goal #2   Pt will identify two food to avoid in a diabetic diet within the next 30 days.  THN CM Short Term Goal #2 Start Date  06/05/19  [facility] CM Short Term Goal #2 Met Date  07/12/19     Raina Mina, RN Care Management Coordinator [place] Office [phone]

## 2019-07-25 ENCOUNTER — Ambulatory Visit (INDEPENDENT_AMBULATORY_CARE_PROVIDER_SITE_OTHER): Payer: Medicare Other | Admitting: Podiatry

## 2019-07-25 ENCOUNTER — Other Ambulatory Visit: Payer: Self-pay

## 2019-07-25 ENCOUNTER — Encounter: Payer: Self-pay | Admitting: Podiatry

## 2019-07-25 DIAGNOSIS — M79675 Pain in left toe(s): Secondary | ICD-10-CM | POA: Diagnosis not present

## 2019-07-25 DIAGNOSIS — B351 Tinea unguium: Secondary | ICD-10-CM

## 2019-07-25 DIAGNOSIS — M79674 Pain in right toe(s): Secondary | ICD-10-CM | POA: Diagnosis not present

## 2019-07-25 DIAGNOSIS — E119 Type 2 diabetes mellitus without complications: Secondary | ICD-10-CM

## 2019-07-25 NOTE — Progress Notes (Signed)
Complaint:  Visit Type: Patient returns to my office for continued preventative foot care services. Complaint: Patient states" my nails have grown long and thick and become painful to walk and wear shoes" Patient has been diagnosed with DM with no foot complications. The patient presents for preventative foot care services. No changes to ROS  Podiatric Exam: Vascular: dorsalis pedis and posterior tibial pulses are palpable bilateral. Capillary return is immediate. Temperature gradient is WNL. Skin turgor WNL  Sensorium: Normal Semmes Weinstein monofilament test. Normal tactile sensation bilaterally. Nail Exam: Pt has thick disfigured discolored nails with subungual debris noted bilateral entire nail hallux through fifth toenails Ulcer Exam: There is no evidence of ulcer or pre-ulcerative changes or infection. Orthopedic Exam: Muscle tone and strength are WNL. No limitations in general ROM. No crepitus or effusions noted. Foot type and digits show no abnormalities. Bony prominences are unremarkable. Skin: No Porokeratosis. No infection or ulcers  Diagnosis:  Onychomycosis, , Pain in right toe, pain in left toes  Treatment & Plan Procedures and Treatment: Consent by patient was obtained for treatment procedures.   Debridement of mycotic and hypertrophic toenails, 1 through 5 bilateral and clearing of subungual debris. No ulceration, no infection noted.  Return Visit-Office Procedure: Patient instructed to return to the office for a follow up visit 10 weeks  for continued evaluation and treatment.    Yarelis Ambrosino DPM 

## 2019-07-29 DIAGNOSIS — N183 Chronic kidney disease, stage 3 unspecified: Secondary | ICD-10-CM | POA: Diagnosis not present

## 2019-07-29 DIAGNOSIS — E1122 Type 2 diabetes mellitus with diabetic chronic kidney disease: Secondary | ICD-10-CM | POA: Diagnosis not present

## 2019-07-29 DIAGNOSIS — I129 Hypertensive chronic kidney disease with stage 1 through stage 4 chronic kidney disease, or unspecified chronic kidney disease: Secondary | ICD-10-CM | POA: Diagnosis not present

## 2019-07-31 DIAGNOSIS — E119 Type 2 diabetes mellitus without complications: Secondary | ICD-10-CM | POA: Diagnosis not present

## 2019-07-31 DIAGNOSIS — Z0000 Encounter for general adult medical examination without abnormal findings: Secondary | ICD-10-CM | POA: Diagnosis not present

## 2019-07-31 DIAGNOSIS — Z0001 Encounter for general adult medical examination with abnormal findings: Secondary | ICD-10-CM | POA: Diagnosis not present

## 2019-07-31 DIAGNOSIS — E1165 Type 2 diabetes mellitus with hyperglycemia: Secondary | ICD-10-CM | POA: Diagnosis not present

## 2019-07-31 DIAGNOSIS — H04203 Unspecified epiphora, bilateral lacrimal glands: Secondary | ICD-10-CM | POA: Diagnosis not present

## 2019-07-31 DIAGNOSIS — Z1389 Encounter for screening for other disorder: Secondary | ICD-10-CM | POA: Diagnosis not present

## 2019-08-21 DIAGNOSIS — E119 Type 2 diabetes mellitus without complications: Secondary | ICD-10-CM | POA: Diagnosis not present

## 2019-08-21 DIAGNOSIS — H5213 Myopia, bilateral: Secondary | ICD-10-CM | POA: Diagnosis not present

## 2019-08-21 DIAGNOSIS — H25013 Cortical age-related cataract, bilateral: Secondary | ICD-10-CM | POA: Diagnosis not present

## 2019-08-26 DIAGNOSIS — I129 Hypertensive chronic kidney disease with stage 1 through stage 4 chronic kidney disease, or unspecified chronic kidney disease: Secondary | ICD-10-CM | POA: Diagnosis not present

## 2019-08-26 DIAGNOSIS — E1122 Type 2 diabetes mellitus with diabetic chronic kidney disease: Secondary | ICD-10-CM | POA: Diagnosis not present

## 2019-08-26 DIAGNOSIS — N183 Chronic kidney disease, stage 3 unspecified: Secondary | ICD-10-CM | POA: Diagnosis not present

## 2019-09-05 ENCOUNTER — Encounter: Payer: Self-pay | Admitting: *Deleted

## 2019-09-26 DIAGNOSIS — I129 Hypertensive chronic kidney disease with stage 1 through stage 4 chronic kidney disease, or unspecified chronic kidney disease: Secondary | ICD-10-CM | POA: Diagnosis not present

## 2019-09-26 DIAGNOSIS — N183 Chronic kidney disease, stage 3 unspecified: Secondary | ICD-10-CM | POA: Diagnosis not present

## 2019-09-26 DIAGNOSIS — E1122 Type 2 diabetes mellitus with diabetic chronic kidney disease: Secondary | ICD-10-CM | POA: Diagnosis not present

## 2019-10-03 ENCOUNTER — Other Ambulatory Visit: Payer: Self-pay

## 2019-10-03 ENCOUNTER — Encounter: Payer: Self-pay | Admitting: Podiatry

## 2019-10-03 ENCOUNTER — Ambulatory Visit (INDEPENDENT_AMBULATORY_CARE_PROVIDER_SITE_OTHER): Payer: Medicare Other | Admitting: Podiatry

## 2019-10-03 VITALS — Temp 97.7°F

## 2019-10-03 DIAGNOSIS — B351 Tinea unguium: Secondary | ICD-10-CM

## 2019-10-03 DIAGNOSIS — E119 Type 2 diabetes mellitus without complications: Secondary | ICD-10-CM

## 2019-10-03 DIAGNOSIS — M79674 Pain in right toe(s): Secondary | ICD-10-CM

## 2019-10-03 DIAGNOSIS — M79675 Pain in left toe(s): Secondary | ICD-10-CM | POA: Diagnosis not present

## 2019-10-03 NOTE — Progress Notes (Signed)
This patient returns to my office for at risk foot care.  This patient requires this care by a professional since this patient will be at risk due to having diabetes.  This patient is unable to cut nails himself since the patient cannot reach his nails.These nails are painful walking and wearing shoes.  This patient presents for at risk foot care today.  General Appearance  Alert, conversant and in no acute stress.  Vascular  Dorsalis pedis and posterior tibial  pulses are palpable  bilaterally.  Capillary return is within normal limits  bilaterally. Temperature is within normal limits  bilaterally.  Neurologic  Senn-Weinstein monofilament wire test within normal limits  bilaterally. Muscle power within normal limits bilaterally.  Nails Thick disfigured discolored nails with subungual debris  from hallux to fifth toes bilaterally. No evidence of bacterial infection or drainage bilaterally.  Orthopedic  No limitations of motion  feet .  No crepitus or effusions noted.  No bony pathology or digital deformities noted.  Skin  normotropic skin with no porokeratosis noted bilaterally.  No signs of infections or ulcers noted.     Onychomycosis  Pain in right toes  Pain in left toes  Consent was obtained for treatment procedures.   Mechanical debridement of nails 1-5  bilaterally performed with a nail nipper.  Filed with dremel without incident.    Return office visit  10 weeks                    Told patient to return for periodic foot care and evaluation due to potential at risk complications.   Bedelia Pong DPM   

## 2019-10-04 ENCOUNTER — Other Ambulatory Visit: Payer: Self-pay | Admitting: *Deleted

## 2019-10-04 NOTE — Patient Outreach (Signed)
[facility]) Care Management  10/04/2019  Alex Morton 11-29-1950 [id number]   Telephone Assessment- Unsuccessful  RN attempted outreach call to pt however unsuccessful. RN able to leave a HIPAA approved voice message requesting a call back.   Plan: Will schedule another follow up call over the next week.   Raina Mina, RN Care Management Coordinator [place] Office [phone]

## 2019-10-11 ENCOUNTER — Other Ambulatory Visit: Payer: Self-pay | Admitting: *Deleted

## 2019-10-11 NOTE — Patient Outreach (Signed)
[facility]) Care Management  10/11/2019  Alex Morton 23-Dec-1950 [id number]  Telephone Assessment-Unsuccessful  RN attempted outreach call to pt however unsuccessful. RN able to leave a HIPAA approved voice message requesting a call back.  PLAN: Will send outreach and scheduled another follow up call over the next 2 weeks.  Raina Mina, RN Care Management Coordinator [place] Office [phone]

## 2019-10-22 ENCOUNTER — Other Ambulatory Visit: Payer: Self-pay | Admitting: *Deleted

## 2019-10-22 NOTE — Patient Outreach (Signed)
[facility]) Care Management  10/22/2019  Alex Morton 11/18/1950 [id number]   Telephone Assessment-Diabetes  RN spoke with pt today and received a update on his ongoing management of care. Pt reports his most recent A1c 7.2 March and BS in the AM range from 138-140. Pt express how he has changed his diet avoid carb products with no breads. Pt with a clearer understanding on how carbohydrates can cause an increase in his A1c and overall BS. Pt feels he is more control in managing his diabetes by adjusting his dietary intake with better monitoring skills over the last several months.   Plan of care discussed with goals and interventions adjusted accordingly based upon pt's efforts in monitoring his diabetes. Pt reports a clear eye exam recently with no known disease or issues as he continue to obtain the recommended screenings and advice in managing his diabetes. Will continue to offer needed resources as pt continue to manage his diabetes. Will continue to follow up quarterly and update pt's provider accordingly on pt's disposition with [facility] services.  THN CM Care Plan Problem One     Most Recent Value  Care Plan Problem One  Diabetes Knowledge Related to Diabetes Mangement (unfamiliarity with information)  Role Documenting the Problem One  Care Management Telephonic Coordinator  Care Plan for Problem One  Active  THN Long Term Goal   Pt will verbalize Hgb A1c reduction by 1-2 points within the next 90 days.  THN Long Term Goal Start Date  06/05/19  Interventions for Problem One Long Term Goal  Will continue to educate pt on reducing in A1c with a report for march @ 7.2 however pt aware he needs to reduce this number even lower. Will continue to stress the improtance of low carb and high protein dietary habits in monitoringhis ongong diabetes.       Raina Mina, RN Care Management Coordinator [place] Office [phone]

## 2019-10-26 DIAGNOSIS — N183 Chronic kidney disease, stage 3 unspecified: Secondary | ICD-10-CM | POA: Diagnosis not present

## 2019-10-26 DIAGNOSIS — I129 Hypertensive chronic kidney disease with stage 1 through stage 4 chronic kidney disease, or unspecified chronic kidney disease: Secondary | ICD-10-CM | POA: Diagnosis not present

## 2019-10-26 DIAGNOSIS — E1122 Type 2 diabetes mellitus with diabetic chronic kidney disease: Secondary | ICD-10-CM | POA: Diagnosis not present

## 2019-12-12 ENCOUNTER — Other Ambulatory Visit: Payer: Self-pay

## 2019-12-12 ENCOUNTER — Ambulatory Visit: Payer: Medicare Other | Admitting: Podiatry

## 2019-12-12 ENCOUNTER — Encounter: Payer: Self-pay | Admitting: Podiatry

## 2019-12-12 DIAGNOSIS — B351 Tinea unguium: Secondary | ICD-10-CM

## 2019-12-12 DIAGNOSIS — M79675 Pain in left toe(s): Secondary | ICD-10-CM

## 2019-12-12 DIAGNOSIS — E119 Type 2 diabetes mellitus without complications: Secondary | ICD-10-CM

## 2019-12-12 DIAGNOSIS — M79674 Pain in right toe(s): Secondary | ICD-10-CM

## 2019-12-12 NOTE — Patient Instructions (Signed)
Diabetes Mellitus and Foot Care Foot care is an important part of your health, especially when you have diabetes. Diabetes may cause you to have problems because of poor blood flow (circulation) to your feet and legs, which can cause your skin to:  Become thinner and drier.  Break more easily.  Heal more slowly.  Peel and crack. You may also have nerve damage (neuropathy) in your legs and feet, causing decreased feeling in them. This means that you may not notice minor injuries to your feet that could lead to more serious problems. Noticing and addressing any potential problems early is the best way to prevent future foot problems. How to care for your feet Foot hygiene  Wash your feet daily with warm water and mild soap. Do not use hot water. Then, pat your feet and the areas between your toes until they are completely dry. Do not soak your feet as this can dry your skin.  Trim your toenails straight across. Do not dig under them or around the cuticle. File the edges of your nails with an emery board or nail file.  Apply a moisturizing lotion or petroleum jelly to the skin on your feet and to dry, brittle toenails. Use lotion that does not contain alcohol and is unscented. Do not apply lotion between your toes. Shoes and socks  Wear clean socks or stockings every day. Make sure they are not too tight. Do not wear knee-high stockings since they may decrease blood flow to your legs.  Wear shoes that fit properly and have enough cushioning. Always look in your shoes before you put them on to be sure there are no objects inside.  To break in new shoes, wear them for just a few hours a day. This prevents injuries on your feet. Wounds, scrapes, corns, and calluses  Check your feet daily for blisters, cuts, bruises, sores, and redness. If you cannot see the bottom of your feet, use a mirror or ask someone for help.  Do not cut corns or calluses or try to remove them with medicine.  If you  find a minor scrape, cut, or break in the skin on your feet, keep it and the skin around it clean and dry. You may clean these areas with mild soap and water. Do not clean the area with peroxide, alcohol, or iodine.  If you have a wound, scrape, corn, or callus on your foot, look at it several times a day to make sure it is healing and not infected. Check for: ? Redness, swelling, or pain. ? Fluid or blood. ? Warmth. ? Pus or a bad smell. General instructions  Do not cross your legs. This may decrease blood flow to your feet.  Do not use heating pads or hot water bottles on your feet. They may burn your skin. If you have lost feeling in your feet or legs, you may not know this is happening until it is too late.  Protect your feet from hot and cold by wearing shoes, such as at the beach or on hot pavement.  Schedule a complete foot exam at least once a year (annually) or more often if you have foot problems. If you have foot problems, report any cuts, sores, or bruises to your health care provider immediately. Contact a health care provider if:  You have a medical condition that increases your risk of infection and you have any cuts, sores, or bruises on your feet.  You have an injury that is not   healing.  You have redness on your legs or feet.  You feel burning or tingling in your legs or feet.  You have pain or cramps in your legs and feet.  Your legs or feet are numb.  Your feet always feel cold.  You have pain around a toenail. Get help right away if:  You have a wound, scrape, corn, or callus on your foot and: ? You have pain, swelling, or redness that gets worse. ? You have fluid or blood coming from the wound, scrape, corn, or callus. ? Your wound, scrape, corn, or callus feels warm to the touch. ? You have pus or a bad smell coming from the wound, scrape, corn, or callus. ? You have a fever. ? You have a red line going up your leg. Summary  Check your feet every day  for cuts, sores, red spots, swelling, and blisters.  Moisturize feet and legs daily.  Wear shoes that fit properly and have enough cushioning.  If you have foot problems, report any cuts, sores, or bruises to your health care provider immediately.  Schedule a complete foot exam at least once a year (annually) or more often if you have foot problems. This information is not intended to replace advice given to you by your health care provider. Make sure you discuss any questions you have with your health care provider. Document Revised: 03/07/2019 Document Reviewed: 07/16/2016 Elsevier Patient Education  2020 Elsevier Inc.  

## 2019-12-20 NOTE — Progress Notes (Signed)
Subjective: Alex Morton presents today preventative diabetic foot care and painful mycotic nails b/l that are difficult to trim. Pain interferes with ambulation. Aggravating factors include wearing enclosed shoe gear. Pain is relieved with periodic professional debridement.  Redmond School, MD is patient's PCP. Last visit was: 07/29/2019.  He voices no new pedal concerns on today's visit.  Past Medical History:  Diagnosis Date  . Diabetes mellitus without complication ([place])   . Hyperlipidemia   . Hypertension      Current Outpatient Medications on File Prior to Visit  Medication Sig Dispense Refill  . amLODipine (NORVASC) 10 MG tablet Take 10 mg by mouth daily.    Marland Kitchen atorvastatin (LIPITOR) 10 MG tablet Take 10 mg by mouth daily.  11  . Blood Glucose Monitoring Suppl ([place]) w/Device KIT See admin instructions.    . [employer] COENZYME Q-10 100 MG capsule Take 100 mg by mouth daily.  11  . doxycycline (VIBRAMYCIN) 100 MG capsule Take 100 mg by mouth 2 (two) times daily.    . fluconazole (DIFLUCAN) 150 MG tablet Take 150 mg by mouth daily.    Marland Kitchen gabapentin (NEURONTIN) 600 MG tablet Take 600 mg by mouth 3 (three) times daily. Two tabs at bedtime    . glimepiride (AMARYL) 2 MG tablet     . Lancets (ONETOUCH DELICA PLUS SLPNPY05R) MISC USE TO TEST ONCE DAILY  2  . metoprolol (LOPRESSOR) 100 MG tablet Take 100 mg by mouth 2 (two) times daily.    Glory Rosebush VERIO test strip USE TO TEST BLOOD SUGAR DAILY    . pioglitazone (ACTOS) 30 MG tablet Take 30 mg by mouth daily.    . pravastatin (PRAVACHOL) 10 MG tablet Take 10 mg by mouth daily.     No current facility-administered medications on file prior to visit.     No Known Allergies  Objective: Alex Morton is a pleasant 69 y.o. African American male WD, WN in NAD. AAO x 3.  There were no vitals filed for this visit.  Vascular Examination: Capillary refill time to digits immediate b/l. Palpable pedal pulses  b/l LE. Pedal hair sparse. Lower extremity skin temperature gradient within normal limits. No pain with calf compression b/l.  Dermatological Examination: Pedal skin with normal turgor, texture and tone bilaterally. No open wounds bilaterally. No interdigital macerations bilaterally. Toenails 1-5 b/l elongated, discolored, dystrophic, thickened, crumbly with subungual debris and tenderness to dorsal palpation.  Musculoskeletal: Normal muscle strength 5/5 to all lower extremity muscle groups bilaterally. No pain crepitus or joint limitation noted with ROM b/l. No gross bony deformities bilaterally. Patient ambulates independent of any assistive aids.  Neurological Examination: Protective sensation intact 5/5 intact bilaterally with 10g monofilament b/l. Vibratory sensation intact b/l. Proprioception intact bilaterally. Babinski reflex negative b/l. Clonus negative b/l.  Assessment: 1. Pain due to onychomycosis of toenails of both feet   2. Diabetes mellitus without complication ([place])   Plan: -Examined patient. -No new findings. No new orders. -Continue diabetic foot care principles. -Toenails 1-5 b/l were debrided in length and girth with sterile nail nippers and dremel without iatrogenic bleeding.  -Patient to report any pedal injuries to medical professional immediately. -Patient to continue soft, supportive shoe gear daily. -Patient/POA to call should there be question/concern in the interim.  Return in about 3 months (around 03/13/2020) for diabetic nail trim.  Marzetta Board, DPM

## 2019-12-26 DIAGNOSIS — E1122 Type 2 diabetes mellitus with diabetic chronic kidney disease: Secondary | ICD-10-CM | POA: Diagnosis not present

## 2019-12-26 DIAGNOSIS — R609 Edema, unspecified: Secondary | ICD-10-CM | POA: Diagnosis not present

## 2019-12-26 DIAGNOSIS — N183 Chronic kidney disease, stage 3 unspecified: Secondary | ICD-10-CM | POA: Diagnosis not present

## 2019-12-26 DIAGNOSIS — I129 Hypertensive chronic kidney disease with stage 1 through stage 4 chronic kidney disease, or unspecified chronic kidney disease: Secondary | ICD-10-CM | POA: Diagnosis not present

## 2019-12-26 DIAGNOSIS — E1129 Type 2 diabetes mellitus with other diabetic kidney complication: Secondary | ICD-10-CM | POA: Diagnosis not present

## 2019-12-27 ENCOUNTER — Other Ambulatory Visit: Payer: Self-pay | Admitting: *Deleted

## 2020-01-18 ENCOUNTER — Other Ambulatory Visit: Payer: Self-pay | Admitting: *Deleted

## 2020-01-18 NOTE — Patient Outreach (Signed)
[facility]) Care Management  01/18/2020  Alex Morton 09/02/1950 [id number]   Telephone Assessment  Spoke with pt today as he is doing well with no acute issues. States he is taking all medications as program with a new medication (Alprazolam) to help him sleep better at night. RN educated on this medication. Pt reports his glucose reading continue to improved with this AM read at 85. States his goal is to continue to eat health in reduce his numbers. States recent provider visit with his new A1c at 7.1 from 7.2 in March.  No reported issues as pt encouraged to continue his regimen in managing his ongoing diabetes.  Based up the above progress discuss pt's transitioning over to a Health Coach for ongoing disease management services. Pt receptive to this plan and will continue to manage his diabetes.  Plan: Will communicate with pt's provider on pt's disposition with [facility] services and refer accordingly.  Raina Mina, RN Care Management Coordinator [place] Office [phone]

## 2020-01-21 ENCOUNTER — Other Ambulatory Visit: Payer: Self-pay | Admitting: *Deleted

## 2020-01-30 DIAGNOSIS — I129 Hypertensive chronic kidney disease with stage 1 through stage 4 chronic kidney disease, or unspecified chronic kidney disease: Secondary | ICD-10-CM | POA: Diagnosis not present

## 2020-01-30 DIAGNOSIS — R600 Localized edema: Secondary | ICD-10-CM | POA: Diagnosis not present

## 2020-01-30 DIAGNOSIS — R809 Proteinuria, unspecified: Secondary | ICD-10-CM | POA: Diagnosis not present

## 2020-01-30 DIAGNOSIS — N189 Chronic kidney disease, unspecified: Secondary | ICD-10-CM | POA: Diagnosis not present

## 2020-01-30 DIAGNOSIS — Z79899 Other long term (current) drug therapy: Secondary | ICD-10-CM | POA: Diagnosis not present

## 2020-02-06 ENCOUNTER — Other Ambulatory Visit: Payer: Self-pay | Admitting: *Deleted

## 2020-02-06 NOTE — Patient Outreach (Signed)
[facility]) Care Management  02/06/2020  FABRICIO ENDSLEY 03-19-1951 [id number]  Unsuccessful outreach attempt made to patient. Patient answered the phone and stated that he was unable to speak at this time. Nurse asked permission to call patient back at a later date. Patient asked if nurse could call him back tomorrow.   Plan: RN Health Coach will call patient 02/07/20.   Emelia Loron RN, BSN [place] [phone] [email or web address]

## 2020-02-07 ENCOUNTER — Other Ambulatory Visit: Payer: Self-pay | Admitting: *Deleted

## 2020-02-07 NOTE — Patient Outreach (Signed)
[facility]) Care Management  02/07/2020  TYLYN DERWIN 1951/06/27 [id number]  Unsuccessful outreach attempt made to patient. RN Health Coach left HIPAA compliant voicemail message along with her contact information.  Plan: RN Health Coach will call patient within the month of September.  Emelia Loron RN, BSN [place] [phone] [email or web address]

## 2020-02-21 DIAGNOSIS — N189 Chronic kidney disease, unspecified: Secondary | ICD-10-CM | POA: Diagnosis not present

## 2020-02-21 DIAGNOSIS — E1122 Type 2 diabetes mellitus with diabetic chronic kidney disease: Secondary | ICD-10-CM | POA: Diagnosis not present

## 2020-02-21 DIAGNOSIS — R809 Proteinuria, unspecified: Secondary | ICD-10-CM | POA: Diagnosis not present

## 2020-02-21 DIAGNOSIS — D519 Vitamin B12 deficiency anemia, unspecified: Secondary | ICD-10-CM | POA: Diagnosis not present

## 2020-02-21 DIAGNOSIS — I129 Hypertensive chronic kidney disease with stage 1 through stage 4 chronic kidney disease, or unspecified chronic kidney disease: Secondary | ICD-10-CM | POA: Diagnosis not present

## 2020-02-21 DIAGNOSIS — E1129 Type 2 diabetes mellitus with other diabetic kidney complication: Secondary | ICD-10-CM | POA: Diagnosis not present

## 2020-02-26 DIAGNOSIS — I129 Hypertensive chronic kidney disease with stage 1 through stage 4 chronic kidney disease, or unspecified chronic kidney disease: Secondary | ICD-10-CM | POA: Diagnosis not present

## 2020-02-26 DIAGNOSIS — N1831 Chronic kidney disease, stage 3a: Secondary | ICD-10-CM | POA: Diagnosis not present

## 2020-02-26 DIAGNOSIS — E1122 Type 2 diabetes mellitus with diabetic chronic kidney disease: Secondary | ICD-10-CM | POA: Diagnosis not present

## 2020-02-29 DIAGNOSIS — N1831 Chronic kidney disease, stage 3a: Secondary | ICD-10-CM | POA: Diagnosis not present

## 2020-02-29 DIAGNOSIS — Z79899 Other long term (current) drug therapy: Secondary | ICD-10-CM | POA: Diagnosis not present

## 2020-02-29 DIAGNOSIS — D631 Anemia in chronic kidney disease: Secondary | ICD-10-CM | POA: Diagnosis not present

## 2020-02-29 DIAGNOSIS — R809 Proteinuria, unspecified: Secondary | ICD-10-CM | POA: Diagnosis not present

## 2020-02-29 DIAGNOSIS — E559 Vitamin D deficiency, unspecified: Secondary | ICD-10-CM | POA: Diagnosis not present

## 2020-03-05 DIAGNOSIS — D631 Anemia in chronic kidney disease: Secondary | ICD-10-CM | POA: Diagnosis not present

## 2020-03-05 DIAGNOSIS — N189 Chronic kidney disease, unspecified: Secondary | ICD-10-CM | POA: Diagnosis not present

## 2020-03-05 DIAGNOSIS — R809 Proteinuria, unspecified: Secondary | ICD-10-CM | POA: Diagnosis not present

## 2020-03-05 DIAGNOSIS — E559 Vitamin D deficiency, unspecified: Secondary | ICD-10-CM | POA: Diagnosis not present

## 2020-03-12 ENCOUNTER — Encounter: Payer: Self-pay | Admitting: Podiatry

## 2020-03-12 ENCOUNTER — Other Ambulatory Visit: Payer: Self-pay

## 2020-03-12 ENCOUNTER — Ambulatory Visit: Payer: Medicare Other | Admitting: Podiatry

## 2020-03-12 DIAGNOSIS — B351 Tinea unguium: Secondary | ICD-10-CM | POA: Diagnosis not present

## 2020-03-12 DIAGNOSIS — E119 Type 2 diabetes mellitus without complications: Secondary | ICD-10-CM

## 2020-03-12 DIAGNOSIS — M79674 Pain in right toe(s): Secondary | ICD-10-CM | POA: Diagnosis not present

## 2020-03-12 DIAGNOSIS — M79675 Pain in left toe(s): Secondary | ICD-10-CM | POA: Diagnosis not present

## 2020-03-12 NOTE — Progress Notes (Signed)
This patient returns to my office for at risk foot care.  This patient requires this care by a professional since this patient will be at risk due to having diabetes.  This patient is unable to cut nails himself since the patient cannot reach his nails.These nails are painful walking and wearing shoes.  This patient presents for at risk foot care today.  General Appearance  Alert, conversant and in no acute stress.  Vascular  Dorsalis pedis and posterior tibial  pulses are palpable  bilaterally.  Capillary return is within normal limits  bilaterally. Temperature is within normal limits  bilaterally.  Neurologic  Senn-Weinstein monofilament wire test within normal limits  bilaterally. Muscle power within normal limits bilaterally.  Nails Thick disfigured discolored nails with subungual debris  from hallux to fifth toes bilaterally. No evidence of bacterial infection or drainage bilaterally.  Orthopedic  No limitations of motion  feet .  No crepitus or effusions noted.  No bony pathology or digital deformities noted.  Skin  normotropic skin with no porokeratosis noted bilaterally.  No signs of infections or ulcers noted.     Onychomycosis  Pain in right toes  Pain in left toes  Consent was obtained for treatment procedures.   Mechanical debridement of nails 1-5  bilaterally performed with a nail nipper.  Filed with dremel without incident.    Return office visit  10 weeks                    Told patient to return for periodic foot care and evaluation due to potential at risk complications.   Jaiden Wahab DPM   

## 2020-03-14 DIAGNOSIS — Z23 Encounter for immunization: Secondary | ICD-10-CM | POA: Diagnosis not present

## 2020-03-14 DIAGNOSIS — E538 Deficiency of other specified B group vitamins: Secondary | ICD-10-CM | POA: Diagnosis not present

## 2020-03-20 ENCOUNTER — Other Ambulatory Visit: Payer: Self-pay | Admitting: *Deleted

## 2020-03-20 ENCOUNTER — Encounter: Payer: Self-pay | Admitting: *Deleted

## 2020-03-20 NOTE — Patient Outreach (Signed)
[facility]) Care Management  [place]  03/20/2020   KEEFER SOULLIERE [birth year] [id number]  Subjective: Successful telephone outreach call to patient. HIPAA identifiers obtained. Patient reports he is doing well. He states his home environment is safe, he has good support from his family, denies having any falls, has no needs for DME, and feels well emotionally.   Patient reports that he is taking his FBS daily and has ranges from 120-160. He is monitoring sugar, carbohydrates, and sodium in his diet. Patient states he is interested in starting to exercise and will check into Silver Sneakers. Adding that he use to walk but a heel spur prevents this now due to the pain. Patient states he needs a B/P monitor to measure B/P and that he is concerned about his renal function. He had an appointment with nephrology on 03/05/20 and will follow-up with them on 05/07/20. He also saw PCP on 03/14/20. Nurse did provide basic education regarding kidney disease. Patient states he has had his annual high dose flu shot for this year. Patient did not have any further question or concerns today.   Encounter Medications:  Outpatient Encounter Medications as of 03/20/2020  Medication Sig Note  . ALPRAZolam (XANAX XR) 0.5 MG 24 hr tablet Take 0.5 mg by mouth daily.   Marland Kitchen amLODipine (NORVASC) 10 MG tablet Take 10 mg by mouth daily.   Marland Kitchen atorvastatin (LIPITOR) 10 MG tablet Take 10 mg by mouth daily.   . Blood Glucose Monitoring Suppl ([place]) w/Device KIT See admin instructions.   . chlorthalidone (HYGROTON) 25 MG tablet Take 25 mg by mouth daily.   . [employer] COENZYME Q-10 100 MG capsule Take 100 mg by mouth daily. 03/20/2020: completed  . ergocalciferol (VITAMIN D2) 1.25 MG (50000 UT) capsule Take by mouth.   . gabapentin (NEURONTIN) 600 MG tablet Take 600 mg by mouth 3 (three) times daily. Two tabs at bedtime   . glimepiride (AMARYL) 2 MG tablet    . Lancets (ONETOUCH DELICA  PLUS [id number]) MISC USE TO TEST ONCE DAILY   . lisinopril (ZESTRIL) 20 MG tablet Take by mouth.   . metoprolol (LOPRESSOR) 100 MG tablet Take 100 mg by mouth 2 (two) times daily.   Glory Rosebush VERIO test strip USE TO TEST BLOOD SUGAR DAILY   . pioglitazone (ACTOS) 30 MG tablet Take 30 mg by mouth daily.    . pravastatin (PRAVACHOL) 10 MG tablet Take 10 mg by mouth daily.   Marland Kitchen doxycycline (VIBRAMYCIN) 100 MG capsule Take 100 mg by mouth 2 (two) times daily.  (Patient not taking: Reported on 03/20/2020) 03/20/2020: completed  . fluconazole (DIFLUCAN) 150 MG tablet Take 150 mg by mouth daily. (Patient not taking: Reported on 03/20/2020) 03/20/2020: completed   No facility-administered encounter medications on file as of 03/20/2020.    Functional Status:  In your present state of health, do you have any difficulty performing the following activities: 06/11/2019  Hearing? N  Vision? N  Difficulty concentrating or making decisions? N  Walking or climbing stairs? N  Dressing or bathing? N  Doing errands, shopping? N  Preparing Food and eating ? N  Using the Toilet? N  In the past six months, have you accidently leaked urine? N  Do you have problems with loss of bowel control? N  Managing your Medications? N  Managing your Finances? N  Housekeeping or managing your Housekeeping? N  Some recent data might be hidden    Fall/Depression Screening:  Fall Risk  03/20/2020 06/11/2019  Falls in the past year? 0 0  Number falls in past yr: 0 -  Injury with Fall? 0 -  Follow up Falls prevention discussed;Falls evaluation completed -   PHQ 2/9 Scores 03/20/2020 06/11/2019  PHQ - 2 Score 0 0   Goals Addressed            This Visit's Progress   . Patient will maintain A1c below 7 within the next 90 days       [place] (see longitudinal plan of care for additional care plan information)  Objective:  . No results found for: HGBA1C . No results found for: CREATININE . No results found for:  EGFR  Current Barriers:  Marland Kitchen Knowledge Deficits related to basic Diabetes pathophysiology and self care/management  Case Manager Clinical Goal(s):  Over the next 90 days, patient will demonstrate improved adherence to prescribed treatment plan for diabetes self care/management as evidenced by:  Marland Kitchen Verbalize daily monitoring and recording of CBG within 90 days . Verbalize adherence to ADA/ carb modified diet within the next 90 days . Verbalize exercise 1-2 days/week . Verbalize adherence to prescribed medication regimen within the next 90 days  . Patient will begin to monitor his B/P daily and record when he receives B/P monitor.   Interventions:  . Provided education to patient about basic DM disease process . Reviewed medications with patient and discussed importance of medication adherence . Discussed plans with patient for ongoing care management follow up and provided patient with direct contact information for care management team . Advised patient, providing education and rationale, to check cbg daily and record, calling doctor for findings outside established parameters.   . Nurse will send patient a B/P monitor.  Patient Self Care Activities:  . Self administers oral medications as prescribed . Attends all scheduled provider appointments . Checks blood sugars as prescribed and utilize hyper and hypoglycemia protocol as needed . Adheres to prescribed ADA/carb modified . Patient states he would like to start exercising and he will look into joining [employer]. Patient states he will begin to take B/P when he receives monitor  Initial goal documentation       Plan: [place] will send PCP a Barrier Letter and today's assessment note, will send patient a B/P cuff and Advance Directive Document, will call patient within the month of November and patient agrees to future outreach calls.   Emelia Loron RN, BSN [place] [phone] [email or web address]

## 2020-03-27 DIAGNOSIS — E1122 Type 2 diabetes mellitus with diabetic chronic kidney disease: Secondary | ICD-10-CM | POA: Diagnosis not present

## 2020-03-27 DIAGNOSIS — N183 Chronic kidney disease, stage 3 unspecified: Secondary | ICD-10-CM | POA: Diagnosis not present

## 2020-03-27 DIAGNOSIS — I129 Hypertensive chronic kidney disease with stage 1 through stage 4 chronic kidney disease, or unspecified chronic kidney disease: Secondary | ICD-10-CM | POA: Diagnosis not present

## 2020-04-09 DIAGNOSIS — E538 Deficiency of other specified B group vitamins: Secondary | ICD-10-CM | POA: Diagnosis not present

## 2020-04-26 DIAGNOSIS — I129 Hypertensive chronic kidney disease with stage 1 through stage 4 chronic kidney disease, or unspecified chronic kidney disease: Secondary | ICD-10-CM | POA: Diagnosis not present

## 2020-04-26 DIAGNOSIS — E1122 Type 2 diabetes mellitus with diabetic chronic kidney disease: Secondary | ICD-10-CM | POA: Diagnosis not present

## 2020-04-26 DIAGNOSIS — N183 Chronic kidney disease, stage 3 unspecified: Secondary | ICD-10-CM | POA: Diagnosis not present

## 2020-05-01 DIAGNOSIS — N189 Chronic kidney disease, unspecified: Secondary | ICD-10-CM | POA: Diagnosis not present

## 2020-05-01 DIAGNOSIS — D631 Anemia in chronic kidney disease: Secondary | ICD-10-CM | POA: Diagnosis not present

## 2020-05-01 DIAGNOSIS — E1122 Type 2 diabetes mellitus with diabetic chronic kidney disease: Secondary | ICD-10-CM | POA: Diagnosis not present

## 2020-05-01 DIAGNOSIS — E559 Vitamin D deficiency, unspecified: Secondary | ICD-10-CM | POA: Diagnosis not present

## 2020-05-07 DIAGNOSIS — N170 Acute kidney failure with tubular necrosis: Secondary | ICD-10-CM | POA: Diagnosis not present

## 2020-05-07 DIAGNOSIS — D631 Anemia in chronic kidney disease: Secondary | ICD-10-CM | POA: Diagnosis not present

## 2020-05-07 DIAGNOSIS — N189 Chronic kidney disease, unspecified: Secondary | ICD-10-CM | POA: Diagnosis not present

## 2020-05-07 DIAGNOSIS — E538 Deficiency of other specified B group vitamins: Secondary | ICD-10-CM | POA: Diagnosis not present

## 2020-05-07 DIAGNOSIS — E559 Vitamin D deficiency, unspecified: Secondary | ICD-10-CM | POA: Diagnosis not present

## 2020-05-09 ENCOUNTER — Other Ambulatory Visit: Payer: Self-pay | Admitting: *Deleted

## 2020-05-09 NOTE — Patient Instructions (Signed)
Goals Addressed            This Visit's Progress    Monitor and Manage My Blood Sugar       Follow Up Date 07/28/19    - check blood sugar at prescribed times - check blood sugar if I feel it is too high or too low - enter blood sugar readings and medication or insulin into daily log - take the blood sugar log to all doctor visits - take the blood sugar meter to all doctor visits    Why is this important?   Checking your blood sugar at home helps to keep it from getting very high or very low.  Writing the results in a diary or log helps the doctor know how to care for you.  Your blood sugar log should have the time, date and the results.  Also, write down the amount of insulin or other medicine that you take.  Other information, like what you ate, exercise done and how you were feeling, will also be helpful.     Notes: Patient monitors his blood sugar daily and records the values     Patient will maintain A1c of 7 or below within the next 90 days   On track    [place] (see longitudinal plan of care for additional care plan information)  Objective:   No results found for: HGBA1C  No results found for: CREATININE  No results found for: EGFR  Current Barriers:   Knowledge Deficits related to basic Diabetes pathophysiology and self care/management  Case Manager Clinical Goal(s):  Over the next 90 days, patient will demonstrate improved adherence to prescribed treatment plan for diabetes self care/management as evidenced by:   Verbalize daily monitoring and recording of CBG within 90 days  Verbalize adherence to ADA/ carb modified diet within the next 90 days  Verbalize exercise 1-2 days/week  Verbalize adherence to prescribed medication regimen within the next 90 days   Patient monitors his B/P routinely.   Interventions:   Provided education to patient about basic DM disease process  Reviewed medications with patient and discussed importance of  medication adherence  Discussed plans with patient for ongoing care management follow up and provided patient with direct contact information for care management team  Advised patient, providing education and rationale, to check cbg daily and record, calling doctor for findings outside established parameters.    Nurse will send EMMI weight loss tips, Advance Directive documents, and planning healthy meals booklet.  Patient Self Care Activities:   Self administers oral medications as prescribed  Attends all scheduled provider appointments  Checks blood sugars as prescribed and utilize hyper and hypoglycemia protocol as needed  Adheres to prescribed ADA/carb modified  Patient states he would like to start exercising and he will look into joining [place]. Patient monitors his B/P routinely  Please see past updates related to this goal by clicking on the "Past Updates" button in the selected goal  Updated: 05/09/20      COMPLETED: Perform Foot Care       Follow Up Date 07/27/20    - check feet daily for cuts, sores or redness - trim toenails straight across - wash and dry feet carefully every day - wear comfortable, cotton socks - wear comfortable, well-fitting shoes    Why is this important?   Good foot care is very important when you have diabetes.  There are many things you can do to keep your feet healthy  and catch a problem early.    Notes: Patient reports looking at his feet daily. Patient routinely goes to the podiatrist.     Set My Target A1C       Follow Up Date 07/27/20    - set target A1C    Why is this important?   Your target A1C is decided together by you and your doctor.  It is based on several things like your age and other health issues.    Notes: Patient's A1c is 7.0

## 2020-05-09 NOTE — Patient Outreach (Signed)
[facility]) Care Management  [place]  05/09/2020   Alex Morton 1950/11/12 [id number]  Subjective: Successful telephone outreach call to patient. HIPAA identifiers obtained. Patient reports he is doing well. He states he did receive B/P cuff that was sent previously but did not receive the Advance Directive documents. Nurse will send patient the documents. Patient reports that his diabetes is currently controlled with his FBS ranges of 80-120. Patient is working to improve his hypertension and states that previously his B/P was 160/100 and now it is 140/90. Patient states he is limiting salt and has lost 3 pounds. He has a goal of losing 20 pounds to help improve his B/P, diabetes, and overall health. Patient explains he plans to lose the weight by eating healthier, increasing his physical activity by joining [employer], and riding the stationary bike. Patient states that he has not had any recent falls and denies any needs for DME. Patient does not have any further questions or concerns today.  Encounter Medications:  Outpatient Encounter Medications as of 05/09/2020  Medication Sig Note  . CVS COENZYME Q-10 100 MG capsule Take 100 mg by mouth daily. 03/20/2020: completed  . ALPRAZolam (XANAX XR) 0.5 MG 24 hr tablet Take 0.5 mg by mouth daily.   Marland Kitchen amLODipine (NORVASC) 10 MG tablet Take 10 mg by mouth daily.   Marland Kitchen atorvastatin (LIPITOR) 10 MG tablet Take 10 mg by mouth daily.   . Blood Glucose Monitoring Suppl ([place]) w/Device KIT See admin instructions.   . chlorthalidone (HYGROTON) 25 MG tablet Take 25 mg by mouth daily.   Marland Kitchen doxycycline (VIBRAMYCIN) 100 MG capsule Take 100 mg by mouth 2 (two) times daily.  (Patient not taking: Reported on 03/20/2020) 03/20/2020: completed  . ergocalciferol (VITAMIN D2) 1.25 MG (50000 UT) capsule Take by mouth.   . fluconazole (DIFLUCAN) 150 MG tablet Take 150 mg by mouth daily. (Patient not taking:  Reported on 03/20/2020) 03/20/2020: completed  . gabapentin (NEURONTIN) 600 MG tablet Take 600 mg by mouth 3 (three) times daily. Two tabs at bedtime   . glimepiride (AMARYL) 2 MG tablet    . Lancets (ONETOUCH DELICA PLUS [id number]) MISC USE TO TEST ONCE DAILY   . lisinopril (ZESTRIL) 20 MG tablet Take by mouth.   . metoprolol (LOPRESSOR) 100 MG tablet Take 100 mg by mouth 2 (two) times daily.   Glory Rosebush VERIO test strip USE TO TEST BLOOD SUGAR DAILY   . pioglitazone (ACTOS) 30 MG tablet Take 30 mg by mouth daily.    . pravastatin (PRAVACHOL) 10 MG tablet Take 10 mg by mouth daily.    No facility-administered encounter medications on file as of 05/09/2020.    Functional Status:  In your present state of health, do you have any difficulty performing the following activities: 06/11/2019  Hearing? N  Vision? N  Difficulty concentrating or making decisions? N  Walking or climbing stairs? N  Dressing or bathing? N  Doing errands, shopping? N  Preparing Food and eating ? N  Using the Toilet? N  In the past six months, have you accidently leaked urine? N  Do you have problems with loss of bowel control? N  Managing your Medications? N  Managing your Finances? N  Housekeeping or managing your Housekeeping? N  Some recent data might be hidden    Fall/Depression Screening: Fall Risk  05/09/2020 03/20/2020 06/11/2019  Falls in the past year? 0 0 0  Number falls in past yr:  0 0 -  Injury with Fall? 0 0 -  Follow up Falls evaluation completed Falls prevention discussed;Falls evaluation completed -   PHQ 2/9 Scores 03/20/2020 06/11/2019  PHQ - 2 Score 0 0    Assessment:  Goals Addressed            This Visit's Progress   . Monitor and Manage My Blood Sugar       Follow Up Date 07/28/19    - check blood sugar at prescribed times - check blood sugar if I feel it is too high or too low - enter blood sugar readings and medication or insulin into daily log - take the blood sugar log  to all doctor visits - take the blood sugar meter to all doctor visits    Why is this important?   Checking your blood sugar at home helps to keep it from getting very high or very low.  Writing the results in a diary or log helps the doctor know how to care for you.  Your blood sugar log should have the time, date and the results.  Also, write down the amount of insulin or other medicine that you take.  Other information, like what you ate, exercise done and how you were feeling, will also be helpful.     Notes: Patient monitors his blood sugar daily and records the values    . Patient will maintain A1c of 7 or below within the next 90 days   On track    [place] (see longitudinal plan of care for additional care plan information)  Objective:  . No results found for: HGBA1C . No results found for: CREATININE . No results found for: EGFR  Current Barriers:  Marland Kitchen Knowledge Deficits related to basic Diabetes pathophysiology and self care/management  Case Manager Clinical Goal(s):  Over the next 90 days, patient will demonstrate improved adherence to prescribed treatment plan for diabetes self care/management as evidenced by:  Marland Kitchen Verbalize daily monitoring and recording of CBG within 90 days . Verbalize adherence to ADA/ carb modified diet within the next 90 days . Verbalize exercise 1-2 days/week . Verbalize adherence to prescribed medication regimen within the next 90 days  . Patient monitors his B/P routinely.   Interventions:  . Provided education to patient about basic DM disease process . Reviewed medications with patient and discussed importance of medication adherence . Discussed plans with patient for ongoing care management follow up and provided patient with direct contact information for care management team . Advised patient, providing education and rationale, to check cbg daily and record, calling doctor for findings outside established parameters.   . Nurse will  send EMMI weight loss tips, Advance Directive documents, and planning healthy meals booklet.  Patient Self Care Activities:  . Self administers oral medications as prescribed . Attends all scheduled provider appointments . Checks blood sugars as prescribed and utilize hyper and hypoglycemia protocol as needed . Adheres to prescribed ADA/carb modified . Patient states he would like to start exercising and he will look into joining [employer]. Patient monitors his B/P routinely  Please see past updates related to this goal by clicking on the "Past Updates" button in the selected goal  Updated: 05/09/20     . COMPLETED: Perform Foot Care       Follow Up Date 07/27/20    - check feet daily for cuts, sores or redness - trim toenails straight across - wash and dry feet carefully every day -  wear comfortable, cotton socks - wear comfortable, well-fitting shoes    Why is this important?   Good foot care is very important when you have diabetes.  There are many things you can do to keep your feet healthy and catch a problem early.    Notes: Patient reports looking at his feet daily. Patient routinely goes to the podiatrist.    . Set My Target A1C       Follow Up Date 07/27/20    - set target A1C    Why is this important?   Your target A1C is decided together by you and your doctor.  It is based on several things like your age and other health issues.    Notes: Patient's A1c is 7.0      Plan: [place] will send patient weight loss tips, planning healthy meals booklet, and Advance Directive documents, will call patient within the month of January, and patient agrees to future outreach calls.   Emelia Loron RN, BSN [place] [phone] [email or web address]

## 2020-05-16 DIAGNOSIS — D631 Anemia in chronic kidney disease: Secondary | ICD-10-CM | POA: Diagnosis not present

## 2020-05-16 DIAGNOSIS — E559 Vitamin D deficiency, unspecified: Secondary | ICD-10-CM | POA: Diagnosis not present

## 2020-05-16 DIAGNOSIS — E1122 Type 2 diabetes mellitus with diabetic chronic kidney disease: Secondary | ICD-10-CM | POA: Diagnosis not present

## 2020-05-16 DIAGNOSIS — N189 Chronic kidney disease, unspecified: Secondary | ICD-10-CM | POA: Diagnosis not present

## 2020-05-21 DIAGNOSIS — E1122 Type 2 diabetes mellitus with diabetic chronic kidney disease: Secondary | ICD-10-CM | POA: Diagnosis not present

## 2020-05-21 DIAGNOSIS — N170 Acute kidney failure with tubular necrosis: Secondary | ICD-10-CM | POA: Diagnosis not present

## 2020-05-21 DIAGNOSIS — N189 Chronic kidney disease, unspecified: Secondary | ICD-10-CM | POA: Diagnosis not present

## 2020-05-21 DIAGNOSIS — D631 Anemia in chronic kidney disease: Secondary | ICD-10-CM | POA: Diagnosis not present

## 2020-05-27 DIAGNOSIS — E1122 Type 2 diabetes mellitus with diabetic chronic kidney disease: Secondary | ICD-10-CM | POA: Diagnosis not present

## 2020-05-27 DIAGNOSIS — I129 Hypertensive chronic kidney disease with stage 1 through stage 4 chronic kidney disease, or unspecified chronic kidney disease: Secondary | ICD-10-CM | POA: Diagnosis not present

## 2020-05-27 DIAGNOSIS — N183 Chronic kidney disease, stage 3 unspecified: Secondary | ICD-10-CM | POA: Diagnosis not present

## 2020-06-04 DIAGNOSIS — E538 Deficiency of other specified B group vitamins: Secondary | ICD-10-CM | POA: Diagnosis not present

## 2020-06-09 DIAGNOSIS — E7849 Other hyperlipidemia: Secondary | ICD-10-CM | POA: Diagnosis not present

## 2020-06-09 DIAGNOSIS — E119 Type 2 diabetes mellitus without complications: Secondary | ICD-10-CM | POA: Diagnosis not present

## 2020-06-10 ENCOUNTER — Ambulatory Visit: Payer: Medicare Other | Admitting: Podiatry

## 2020-06-10 ENCOUNTER — Encounter: Payer: Self-pay | Admitting: Podiatry

## 2020-06-10 ENCOUNTER — Other Ambulatory Visit: Payer: Self-pay

## 2020-06-10 DIAGNOSIS — E119 Type 2 diabetes mellitus without complications: Secondary | ICD-10-CM | POA: Diagnosis not present

## 2020-06-10 DIAGNOSIS — B351 Tinea unguium: Secondary | ICD-10-CM | POA: Diagnosis not present

## 2020-06-10 DIAGNOSIS — M79675 Pain in left toe(s): Secondary | ICD-10-CM | POA: Diagnosis not present

## 2020-06-10 DIAGNOSIS — M79674 Pain in right toe(s): Secondary | ICD-10-CM | POA: Diagnosis not present

## 2020-06-10 NOTE — Progress Notes (Signed)
This patient returns to my office for at risk foot care.  This patient requires this care by a professional since this patient will be at risk due to having diabetes.  This patient is unable to cut nails himself since the patient cannot reach his nails.These nails are painful walking and wearing shoes.  This patient presents for at risk foot care today.  General Appearance  Alert, conversant and in no acute stress.  Vascular  Dorsalis pedis and posterior tibial  pulses are palpable  bilaterally.  Capillary return is within normal limits  bilaterally. Temperature is within normal limits  bilaterally.  Neurologic  Senn-Weinstein monofilament wire test within normal limits  bilaterally. Muscle power within normal limits bilaterally.  Nails Thick disfigured discolored nails with subungual debris  from hallux to fifth toes bilaterally. No evidence of bacterial infection or drainage bilaterally.  Orthopedic  No limitations of motion  feet .  No crepitus or effusions noted.  No bony pathology or digital deformities noted.  Skin  normotropic skin with no porokeratosis noted bilaterally.  No signs of infections or ulcers noted.     Onychomycosis  Pain in right toes  Pain in left toes  Consent was obtained for treatment procedures.   Mechanical debridement of nails 1-5  bilaterally performed with a nail nipper.  Filed with dremel without incident.    Return office visit  12 weeks                    Told patient to return for periodic foot care and evaluation due to potential at risk complications.   Rashema Seawright DPM  

## 2020-06-13 DIAGNOSIS — N170 Acute kidney failure with tubular necrosis: Secondary | ICD-10-CM | POA: Diagnosis not present

## 2020-06-13 DIAGNOSIS — N189 Chronic kidney disease, unspecified: Secondary | ICD-10-CM | POA: Diagnosis not present

## 2020-06-13 DIAGNOSIS — D631 Anemia in chronic kidney disease: Secondary | ICD-10-CM | POA: Diagnosis not present

## 2020-06-13 DIAGNOSIS — E1122 Type 2 diabetes mellitus with diabetic chronic kidney disease: Secondary | ICD-10-CM | POA: Diagnosis not present

## 2020-06-16 DIAGNOSIS — N189 Chronic kidney disease, unspecified: Secondary | ICD-10-CM | POA: Diagnosis not present

## 2020-06-16 DIAGNOSIS — R809 Proteinuria, unspecified: Secondary | ICD-10-CM | POA: Diagnosis not present

## 2020-06-16 DIAGNOSIS — I129 Hypertensive chronic kidney disease with stage 1 through stage 4 chronic kidney disease, or unspecified chronic kidney disease: Secondary | ICD-10-CM | POA: Diagnosis not present

## 2020-06-16 DIAGNOSIS — E559 Vitamin D deficiency, unspecified: Secondary | ICD-10-CM | POA: Diagnosis not present

## 2020-06-16 DIAGNOSIS — N170 Acute kidney failure with tubular necrosis: Secondary | ICD-10-CM | POA: Diagnosis not present

## 2020-06-27 DIAGNOSIS — E1122 Type 2 diabetes mellitus with diabetic chronic kidney disease: Secondary | ICD-10-CM | POA: Diagnosis not present

## 2020-06-27 DIAGNOSIS — N183 Chronic kidney disease, stage 3 unspecified: Secondary | ICD-10-CM | POA: Diagnosis not present

## 2020-06-27 DIAGNOSIS — I129 Hypertensive chronic kidney disease with stage 1 through stage 4 chronic kidney disease, or unspecified chronic kidney disease: Secondary | ICD-10-CM | POA: Diagnosis not present

## 2020-07-07 DIAGNOSIS — E538 Deficiency of other specified B group vitamins: Secondary | ICD-10-CM | POA: Diagnosis not present

## 2020-07-09 ENCOUNTER — Other Ambulatory Visit: Payer: Self-pay | Admitting: *Deleted

## 2020-07-09 NOTE — Patient Outreach (Signed)
[place] [facility]) Care Management  07/09/2020  CRISTOFER YAFFE 1950/11/21 [id number]  Unsuccessful outreach attempt made to patient. Patient answered that phone and stated that he is not able to speak today. Patient did request that this nurse call him back at a later date.   Plan: RN Health Coach will call patient within the month of February.  Emelia Loron RN, BSN [place] [phone] [email or web address]

## 2020-07-26 DIAGNOSIS — E1122 Type 2 diabetes mellitus with diabetic chronic kidney disease: Secondary | ICD-10-CM | POA: Diagnosis not present

## 2020-07-26 DIAGNOSIS — N183 Chronic kidney disease, stage 3 unspecified: Secondary | ICD-10-CM | POA: Diagnosis not present

## 2020-07-26 DIAGNOSIS — I129 Hypertensive chronic kidney disease with stage 1 through stage 4 chronic kidney disease, or unspecified chronic kidney disease: Secondary | ICD-10-CM | POA: Diagnosis not present

## 2020-08-05 DIAGNOSIS — E7849 Other hyperlipidemia: Secondary | ICD-10-CM | POA: Diagnosis not present

## 2020-08-05 DIAGNOSIS — Z1389 Encounter for screening for other disorder: Secondary | ICD-10-CM | POA: Diagnosis not present

## 2020-08-05 DIAGNOSIS — E538 Deficiency of other specified B group vitamins: Secondary | ICD-10-CM | POA: Diagnosis not present

## 2020-08-05 DIAGNOSIS — E782 Mixed hyperlipidemia: Secondary | ICD-10-CM | POA: Diagnosis not present

## 2020-08-05 DIAGNOSIS — Z0000 Encounter for general adult medical examination without abnormal findings: Secondary | ICD-10-CM | POA: Diagnosis not present

## 2020-08-05 DIAGNOSIS — L723 Sebaceous cyst: Secondary | ICD-10-CM | POA: Diagnosis not present

## 2020-08-05 DIAGNOSIS — E119 Type 2 diabetes mellitus without complications: Secondary | ICD-10-CM | POA: Diagnosis not present

## 2020-08-05 DIAGNOSIS — Z7689 Persons encountering health services in other specified circumstances: Secondary | ICD-10-CM | POA: Diagnosis not present

## 2020-08-15 ENCOUNTER — Other Ambulatory Visit: Payer: Self-pay | Admitting: *Deleted

## 2020-08-15 NOTE — Patient Outreach (Signed)
[place]) Care Management  08/15/2020  AZARIAH BONURA July 16, 1950 [id number]  Unsuccessful outreach attempt made to patient. RN Health Coach left HIPAA compliant voicemail message along with her contact information.  Plan: RN Health Coach will call patient within the month of March.  Emelia Loron RN, BSN [place] [phone] [email or web address]

## 2020-08-22 ENCOUNTER — Other Ambulatory Visit (HOSPITAL_COMMUNITY)
Admission: RE | Admit: 2020-08-22 | Discharge: 2020-08-22 | Disposition: A | Discharge status: Home / Self Care | Payer: Medicare Other | Source: Ambulatory Visit | Attending: Nephrology | Admitting: Nephrology

## 2020-08-22 ENCOUNTER — Other Ambulatory Visit: Payer: Self-pay

## 2020-08-22 DIAGNOSIS — E1129 Type 2 diabetes mellitus with other diabetic kidney complication: Secondary | ICD-10-CM | POA: Insufficient documentation

## 2020-08-22 DIAGNOSIS — N170 Acute kidney failure with tubular necrosis: Secondary | ICD-10-CM | POA: Diagnosis not present

## 2020-08-22 DIAGNOSIS — N189 Chronic kidney disease, unspecified: Secondary | ICD-10-CM | POA: Insufficient documentation

## 2020-08-22 DIAGNOSIS — E1122 Type 2 diabetes mellitus with diabetic chronic kidney disease: Secondary | ICD-10-CM | POA: Diagnosis not present

## 2020-08-22 DIAGNOSIS — E559 Vitamin D deficiency, unspecified: Secondary | ICD-10-CM | POA: Insufficient documentation

## 2020-08-22 DIAGNOSIS — R809 Proteinuria, unspecified: Secondary | ICD-10-CM | POA: Diagnosis not present

## 2020-08-22 DIAGNOSIS — I129 Hypertensive chronic kidney disease with stage 1 through stage 4 chronic kidney disease, or unspecified chronic kidney disease: Secondary | ICD-10-CM | POA: Insufficient documentation

## 2020-08-22 LAB — CBC
HCT: 45 % (ref 39.0–52.0)
Hemoglobin: 13.3 g/dL (ref 13.0–17.0)
MCH: 25 pg — ABNORMAL LOW (ref 26.0–34.0)
MCHC: 29.6 g/dL — ABNORMAL LOW (ref 30.0–36.0)
MCV: 84.6 fL (ref 80.0–100.0)
Platelets: 168 10*3/uL (ref 150–400)
RBC: 5.32 MIL/uL (ref 4.22–5.81)
RDW: 14.1 % (ref 11.5–15.5)
WBC: 5.8 10*3/uL (ref 4.0–10.5)
nRBC: 0 % (ref 0.0–0.2)

## 2020-08-22 LAB — RENAL FUNCTION PANEL
Albumin: 3.6 g/dL (ref 3.5–5.0)
Anion gap: 7 (ref 5–15)
BUN: 22 mg/dL (ref 8–23)
CO2: 27 mmol/L (ref 22–32)
Calcium: 8.8 mg/dL — ABNORMAL LOW (ref 8.9–10.3)
Chloride: 102 mmol/L (ref 98–111)
Creatinine, Ser: 1.76 mg/dL — ABNORMAL HIGH (ref 0.61–1.24)
GFR, Estimated: 41 mL/min — ABNORMAL LOW (ref >60)
Glucose, Bld: 138 mg/dL — ABNORMAL HIGH (ref 70–99)
Phosphorus: 3.1 mg/dL (ref 2.5–4.6)
Potassium: 4 mmol/L (ref 3.5–5.1)
Sodium: 136 mmol/L (ref 135–145)

## 2020-08-22 LAB — PROTEIN / CREATININE RATIO, URINE
Creatinine, Urine: 209.84 mg/dL
Protein Creatinine Ratio: 0.21 mg/mg{Cre} — ABNORMAL HIGH (ref 0.00–0.15)
Total Protein, Urine: 45 mg/dL

## 2020-08-25 DIAGNOSIS — N183 Chronic kidney disease, stage 3 unspecified: Secondary | ICD-10-CM | POA: Diagnosis not present

## 2020-08-25 DIAGNOSIS — E1122 Type 2 diabetes mellitus with diabetic chronic kidney disease: Secondary | ICD-10-CM | POA: Diagnosis not present

## 2020-08-25 DIAGNOSIS — I129 Hypertensive chronic kidney disease with stage 1 through stage 4 chronic kidney disease, or unspecified chronic kidney disease: Secondary | ICD-10-CM | POA: Diagnosis not present

## 2020-09-09 ENCOUNTER — Other Ambulatory Visit: Payer: Self-pay | Admitting: *Deleted

## 2020-09-09 NOTE — Patient Outreach (Signed)
[facility]) Care Management  09/09/2020  Alex Morton 1951-06-22 [id number]  Unsuccessful outreach attempt made to patient. Patient answered the phone and stated that he would not be able to speak today. He did request that this nurse call back at a later date.   Plan: RN Health Coach will call patient within the month of April.  Emelia Loron RN, BSN [place] [phone] [email or web address]

## 2020-09-16 DIAGNOSIS — E538 Deficiency of other specified B group vitamins: Secondary | ICD-10-CM | POA: Diagnosis not present

## 2020-09-17 ENCOUNTER — Other Ambulatory Visit: Payer: Self-pay

## 2020-09-17 ENCOUNTER — Encounter: Payer: Self-pay | Admitting: Podiatry

## 2020-09-17 ENCOUNTER — Ambulatory Visit: Payer: Medicare Other | Admitting: Podiatry

## 2020-09-17 DIAGNOSIS — M79674 Pain in right toe(s): Secondary | ICD-10-CM

## 2020-09-17 DIAGNOSIS — E119 Type 2 diabetes mellitus without complications: Secondary | ICD-10-CM | POA: Diagnosis not present

## 2020-09-17 DIAGNOSIS — M79675 Pain in left toe(s): Secondary | ICD-10-CM

## 2020-09-17 DIAGNOSIS — B351 Tinea unguium: Secondary | ICD-10-CM | POA: Diagnosis not present

## 2020-09-17 NOTE — Progress Notes (Signed)
This patient returns to my office for at risk foot care.  This patient requires this care by a professional since this patient will be at risk due to having diabetes.  This patient is unable to cut nails himself since the patient cannot reach his nails.These nails are painful walking and wearing shoes.  This patient presents for at risk foot care today.  General Appearance  Alert, conversant and in no acute stress.  Vascular  Dorsalis pedis and posterior tibial  pulses are palpable  bilaterally.  Capillary return is within normal limits  bilaterally. Temperature is within normal limits  bilaterally.  Neurologic  Senn-Weinstein monofilament wire test within normal limits  bilaterally. Muscle power within normal limits bilaterally.  Nails Thick disfigured discolored nails with subungual debris  from hallux to fifth toes bilaterally. No evidence of bacterial infection or drainage bilaterally.  Orthopedic  No limitations of motion  feet .  No crepitus or effusions noted.  No bony pathology or digital deformities noted.  Skin  normotropic skin with no porokeratosis noted bilaterally.  No signs of infections or ulcers noted.     Onychomycosis  Pain in right toes  Pain in left toes  Consent was obtained for treatment procedures.   Mechanical debridement of nails 1-5  bilaterally performed with a nail nipper.  Filed with dremel without incident.    Return office visit  12 weeks                    Told patient to return for periodic foot care and evaluation due to potential at risk complications.   Eliyahu Bille DPM  

## 2020-09-24 DIAGNOSIS — N183 Chronic kidney disease, stage 3 unspecified: Secondary | ICD-10-CM | POA: Diagnosis not present

## 2020-09-24 DIAGNOSIS — I129 Hypertensive chronic kidney disease with stage 1 through stage 4 chronic kidney disease, or unspecified chronic kidney disease: Secondary | ICD-10-CM | POA: Diagnosis not present

## 2020-09-24 DIAGNOSIS — E1122 Type 2 diabetes mellitus with diabetic chronic kidney disease: Secondary | ICD-10-CM | POA: Diagnosis not present

## 2020-10-09 DIAGNOSIS — J301 Allergic rhinitis due to pollen: Secondary | ICD-10-CM | POA: Diagnosis not present

## 2020-10-09 DIAGNOSIS — Z681 Body mass index (BMI) 19 or less, adult: Secondary | ICD-10-CM | POA: Diagnosis not present

## 2020-10-09 DIAGNOSIS — J22 Unspecified acute lower respiratory infection: Secondary | ICD-10-CM | POA: Diagnosis not present

## 2020-10-20 DIAGNOSIS — E538 Deficiency of other specified B group vitamins: Secondary | ICD-10-CM | POA: Diagnosis not present

## 2020-10-24 ENCOUNTER — Other Ambulatory Visit: Payer: Self-pay | Admitting: *Deleted

## 2020-10-24 NOTE — Patient Instructions (Addendum)
Goals Addressed            This Visit's Progress   . [facility]) Patient will maintain A1c of 6.8 or below within the next 90 days       [place] (see longitudinal plan of care for additional care plan information)  Objective:  . No results found for: HGBA1C . No results found for: CREATININE . No results found for: EGFR  Current Barriers:  Marland Kitchen Knowledge Deficits related to basic Diabetes pathophysiology and self care/management  Case Manager Clinical Goal(s):  Over the next 90 days, patient will demonstrate improved adherence to prescribed treatment plan for diabetes self care/management as evidenced by:  Marland Kitchen Verbalize daily monitoring and recording of CBG within 90 days . Verbalize adherence to ADA/ carb modified diet within the next 90 days . Verbalize exercise 1-2 days/week . Verbalize adherence to prescribed medication regimen within the next 90 days   Interventions:  . Provided education to patient about basic DM disease process . Reviewed medications with patient and discussed importance of medication adherence . Discussed plans with patient for ongoing care management follow up and provided patient with direct contact information for care management team . Advised patient, providing education and rationale, to check cbg daily and record, calling doctor for findings outside established parameters.   . Nurse will send EMMI weight loss tips, Advance Directive documents, and planning healthy meals booklet. . Nurse will send a calendar booklet for the patient to record his blood sugar values  Patient Self Care Activities:  . Self administers oral medications as prescribed . Attends all scheduled provider appointments . Checks blood sugars as prescribed and utilize hyper and hypoglycemia protocol as needed . Adheres to prescribed ADA/carb modified . Patient states he would like to start exercising and he will look into joining [employer]. Patient monitors his B/P  routinely  Please see past updates related to this goal by clicking on the "Past Updates" button in the selected goal  Updated: 05/09/20  Timeframe:  Long-Range Goal Priority:  High Start Date: 03/20/20                           Expected End Date: 03/27/21 Follow Up Date: 01/24/21  Updated 10/24/20:  Patient continues to monitor his blood sugar daily. Nurse will send a calendar booklet for the patient to record his values. Patient states he will look into joining the [employer]                   . [facility]) Patient will verbalize coninuation on monitoring his blood sugar daily and recording the values for the next 90 days       Timeframe:  Long-Range Goal Priority:  Medium Start Date: 05/09/20                            Expected End Date: 05/27/21                      Follow Up Date 01/24/21   - check blood sugar at prescribed times - check blood sugar if I feel it is too high or too low - enter blood sugar readings and medication or insulin into daily log - take the blood sugar log to all doctor visits - take the blood sugar meter to all doctor visits    Why is this important?   Checking  your blood sugar at home helps to keep it from getting very high or very low.  Writing the results in a diary or log helps the doctor know how to care for you.  Your blood sugar log should have the time, date and the results.  Also, write down the amount of insulin or other medicine that you take.  Other information, like what you ate, exercise done and how you were feeling, will also be helpful.     Notes: Patient monitors his blood sugar daily and records the values  Updated 10/24/20: Patient continues to monitor his blood sugar daily. Nurse will send a calendar booklet for the patient to record his values.    Marland Kitchen [facility]) Patient will verbalize continuation of A1c below 6.8 for the next 90 days       Follow Up Date 07/27/20    - set target A1C    Why is this important?   Your target A1C  is decided together by you and your doctor.  It is based on several things like your age and other health issues.    Notes: Updated 10/24/20: Patient's A1c is 6.4    . [facility]) Patient will verbalize continuation of monitoring his B/P daily and and will begin to record the values daily within the next 90 days       Timeframe:  Long-Range Goal Priority:  High Start Date: 10/24/20                            Expected End Date: 10/24/21                      Follow Up Date 01/24/21    - check blood pressure daily - write blood pressure results in a log or diary  -Discussed limiting salt intake -Encouraged continuation of reading food labels -Discussed increasing physical activity as tolerated -Encouraged patient to write his B/P values down daily  Why is this important?    You won't feel high blood pressure, but it can still hurt your blood vessels.   High blood pressure can cause heart or kidney problems. It can also cause a stroke.   Making lifestyle changes like losing a little weight or eating less salt will help.   Checking your blood pressure at home and at different times of the day can help to control blood pressure.   If the doctor prescribes medicine remember to take it the way the doctor ordered.   Call the office if you cannot afford the medicine or if there are questions about it.     Notes: Patient reports taking his blood pressure daily and shared that his values have improved with systolic ranges of 494-496'P. Nurse will send hypertension education and a calendar booklet for the patient to record his B/P values. Patient states that he will look into joining the [employer]

## 2020-10-24 NOTE — Patient Outreach (Signed)
[facility]) Care Management  [place]  10/24/2020   Alex Morton [birth year] [id number]  Subjective: Successful telephone outreach call to patient. HIPAA identifiers obtained. Patient reports that his diabetes is currently controlled with his FBS ranges of 80-130. His last A1c was 6.4 on 06/09/20. Patient is working to improve his hypertension and states that previously his B/P was 160/100 and now it is in the systolic ranges of 474-259'D. Patient reports that he has not been writing down his B/P values. Nurse provided education and will send the patient a calendar booklet to record values. Patient states he is limiting salt and has lost 7-8 pounds. He has a goal of losing 20 pounds to help improve his B/P, diabetes, and overall health. Patient explains he has begun to eat healthier, he is drinking more water, and plans to join the [employer] Patient states that he has not had any recent falls and denies any needs for DME. Patient does not have any further questions or concerns today.  Encounter Medications:  Outpatient Encounter Medications as of 10/24/2020  Medication Sig Note  . ALPRAZolam (XANAX XR) 0.5 MG 24 hr tablet Take 0.5 mg by mouth daily.   Marland Kitchen amLODipine (NORVASC) 10 MG tablet Take 10 mg by mouth daily.   Marland Kitchen atorvastatin (LIPITOR) 10 MG tablet Take 10 mg by mouth daily.   . Blood Glucose Monitoring Suppl ([place]) w/Device KIT See admin instructions.   . chlorthalidone (HYGROTON) 25 MG tablet Take 25 mg by mouth daily.   Marland Kitchen ezetimibe (ZETIA) 10 MG tablet Take 10 mg by mouth daily.   Marland Kitchen gabapentin (NEURONTIN) 600 MG tablet Take 600 mg by mouth 3 (three) times daily. Two tabs at bedtime   . glimepiride (AMARYL) 2 MG tablet    . Lancets (ONETOUCH DELICA PLUS GLOVFI43P) MISC USE TO TEST ONCE DAILY   . lisinopril (ZESTRIL) 20 MG tablet Take by mouth.   . metoprolol (LOPRESSOR) 100 MG tablet Take 100 mg by mouth 2 (two) times  daily.   Glory Rosebush VERIO test strip USE TO TEST BLOOD SUGAR DAILY   . pioglitazone (ACTOS) 30 MG tablet Take 30 mg by mouth daily.    . [facility] COENZYME Q-10 100 MG capsule Take 100 mg by mouth daily. (Patient not taking: Reported on 10/24/2020) 10/24/2020: completed  . doxycycline (VIBRAMYCIN) 100 MG capsule Take 100 mg by mouth 2 (two) times daily. (Patient not taking: Reported on 10/24/2020) 10/24/2020: completed  . fluconazole (DIFLUCAN) 150 MG tablet Take 150 mg by mouth daily. (Patient not taking: Reported on 10/24/2020) 10/24/2020: completed  . pravastatin (PRAVACHOL) 10 MG tablet Take 10 mg by mouth daily. (Patient not taking: Reported on 10/24/2020) 10/24/2020: Stopped due to cramping symptoms   No facility-administered encounter medications on file as of 10/24/2020.    Functional Status:  No flowsheet data found.  Fall/Depression Screening: Fall Risk  10/24/2020 05/09/2020 03/20/2020  Falls in the past year? 0 0 0  Number falls in past yr: 0 0 0  Injury with Fall? 0 0 0  Follow up Falls prevention discussed;Education provided;Falls evaluation completed Falls evaluation completed Falls prevention discussed;Falls evaluation completed   PHQ 2/9 Scores 03/20/2020 06/11/2019  PHQ - 2 Score 0 0    Assessment:  Goals Addressed            This Visit's Progress   . [facility]) Patient will maintain A1c of 6.8 or below within the next 90 days  CARE PLAN ENTRY (see longitudinal plan of care for additional care plan information)  Objective:  . No results found for: HGBA1C . No results found for: CREATININE . No results found for: EGFR  Current Barriers:  Marland Kitchen Knowledge Deficits related to basic Diabetes pathophysiology and self care/management  Case Manager Clinical Goal(s):  Over the next 90 days, patient will demonstrate improved adherence to prescribed treatment plan for diabetes self care/management as evidenced by:  Marland Kitchen Verbalize daily monitoring and recording of CBG within 90  days . Verbalize adherence to ADA/ carb modified diet within the next 90 days . Verbalize exercise 1-2 days/week . Verbalize adherence to prescribed medication regimen within the next 90 days   Interventions:  . Provided education to patient about basic DM disease process . Reviewed medications with patient and discussed importance of medication adherence . Discussed plans with patient for ongoing care management follow up and provided patient with direct contact information for care management team . Advised patient, providing education and rationale, to check cbg daily and record, calling doctor for findings outside established parameters.   . Nurse will send EMMI weight loss tips, Advance Directive documents, and planning healthy meals booklet. . Nurse will send a calendar booklet for the patient to record his blood sugar values  Patient Self Care Activities:  . Self administers oral medications as prescribed . Attends all scheduled provider appointments . Checks blood sugars as prescribed and utilize hyper and hypoglycemia protocol as needed . Adheres to prescribed ADA/carb modified . Patient states he would like to start exercising and he will look into joining [employer]. Patient monitors his B/P routinely  Please see past updates related to this goal by clicking on the "Past Updates" button in the selected goal  Updated: 05/09/20  Timeframe:  Long-Range Goal Priority:  High Start Date: 03/20/20                           Expected End Date: 03/27/21 Follow Up Date: 01/24/21  Updated 10/24/20:  Patient continues to monitor his blood sugar daily. Nurse will send a calendar booklet for the patient to record his values. Patient states he will look into joining the [employer]                   . [facility]) Patient will verbalize coninuation on monitoring his blood sugar daily and recording the values for the next 90 days       Timeframe:  Long-Range Goal Priority:   Medium Start Date: 05/09/20                            Expected End Date: 05/27/21                      Follow Up Date 01/24/21   - check blood sugar at prescribed times - check blood sugar if I feel it is too high or too low - enter blood sugar readings and medication or insulin into daily log - take the blood sugar log to all doctor visits - take the blood sugar meter to all doctor visits    Why is this important?   Checking your blood sugar at home helps to keep it from getting very high or very low.  Writing the results in a diary or log helps the doctor know how to care for you.  Your blood sugar  log should have the time, date and the results.  Also, write down the amount of insulin or other medicine that you take.  Other information, like what you ate, exercise done and how you were feeling, will also be helpful.     Notes: Patient monitors his blood sugar daily and records the values  Updated 10/24/20: Patient continues to monitor his blood sugar daily. Nurse will send a calendar booklet for the patient to record his values.    Marland Kitchen [facility]) Patient will verbalize continuation of A1c below 6.8 for the next 90 days       Follow Up Date 07/27/20    - set target A1C    Why is this important?   Your target A1C is decided together by you and your doctor.  It is based on several things like your age and other health issues.    Notes: Updated 10/24/20: Patient's A1c is 6.4    . [facility]) Patient will verbalize continuation of monitoring his B/P daily and and will begin to record the values daily within the next 90 days       Timeframe:  Long-Range Goal Priority:  High Start Date: 10/24/20                            Expected End Date: 10/24/21                      Follow Up Date 01/24/21    - check blood pressure daily - write blood pressure results in a log or diary  -Discussed limiting salt intake -Encouraged continuation of reading food labels -Discussed increasing physical activity as  tolerated -Encouraged patient to write his B/P values down daily  Why is this important?    You won't feel high blood pressure, but it can still hurt your blood vessels.   High blood pressure can cause heart or kidney problems. It can also cause a stroke.   Making lifestyle changes like losing a little weight or eating less salt will help.   Checking your blood pressure at home and at different times of the day can help to control blood pressure.   If the doctor prescribes medicine remember to take it the way the doctor ordered.   Call the office if you cannot afford the medicine or if there are questions about it.     Notes: Patient reports taking his blood pressure daily and shared that his values have improved with systolic ranges of 295-621'H. Nurse will send hypertension education and a calendar booklet for the patient to record his B/P values. Patient states that he will look into joining the [place]: [place] will send PCP today's assessment note, will send patient hypertension education and a calendar booklet, and will call patient within the month of July. Follow-up:  Patient agrees to Care Plan and Follow-up.  Emelia Loron RN, BSN [place] [phone] [email or web address]

## 2020-10-27 DIAGNOSIS — N189 Chronic kidney disease, unspecified: Secondary | ICD-10-CM | POA: Diagnosis not present

## 2020-10-27 DIAGNOSIS — R809 Proteinuria, unspecified: Secondary | ICD-10-CM | POA: Diagnosis not present

## 2020-10-27 DIAGNOSIS — E559 Vitamin D deficiency, unspecified: Secondary | ICD-10-CM | POA: Diagnosis not present

## 2020-10-27 DIAGNOSIS — N170 Acute kidney failure with tubular necrosis: Secondary | ICD-10-CM | POA: Diagnosis not present

## 2020-10-27 DIAGNOSIS — E1122 Type 2 diabetes mellitus with diabetic chronic kidney disease: Secondary | ICD-10-CM | POA: Diagnosis not present

## 2020-10-30 DIAGNOSIS — E1129 Type 2 diabetes mellitus with other diabetic kidney complication: Secondary | ICD-10-CM | POA: Diagnosis not present

## 2020-10-30 DIAGNOSIS — N189 Chronic kidney disease, unspecified: Secondary | ICD-10-CM | POA: Diagnosis not present

## 2020-10-30 DIAGNOSIS — E1122 Type 2 diabetes mellitus with diabetic chronic kidney disease: Secondary | ICD-10-CM | POA: Diagnosis not present

## 2020-10-30 DIAGNOSIS — I129 Hypertensive chronic kidney disease with stage 1 through stage 4 chronic kidney disease, or unspecified chronic kidney disease: Secondary | ICD-10-CM | POA: Diagnosis not present

## 2020-10-30 DIAGNOSIS — E559 Vitamin D deficiency, unspecified: Secondary | ICD-10-CM | POA: Diagnosis not present

## 2020-10-30 DIAGNOSIS — R809 Proteinuria, unspecified: Secondary | ICD-10-CM | POA: Diagnosis not present

## 2020-11-14 DIAGNOSIS — E538 Deficiency of other specified B group vitamins: Secondary | ICD-10-CM | POA: Diagnosis not present

## 2020-11-17 DIAGNOSIS — L720 Epidermal cyst: Secondary | ICD-10-CM | POA: Diagnosis not present

## 2020-11-17 DIAGNOSIS — L738 Other specified follicular disorders: Secondary | ICD-10-CM | POA: Diagnosis not present

## 2020-11-17 DIAGNOSIS — L821 Other seborrheic keratosis: Secondary | ICD-10-CM | POA: Diagnosis not present

## 2020-11-17 DIAGNOSIS — D2239 Melanocytic nevi of other parts of face: Secondary | ICD-10-CM | POA: Diagnosis not present

## 2020-11-17 DIAGNOSIS — L810 Postinflammatory hyperpigmentation: Secondary | ICD-10-CM | POA: Diagnosis not present

## 2020-11-25 DIAGNOSIS — N183 Chronic kidney disease, stage 3 unspecified: Secondary | ICD-10-CM | POA: Diagnosis not present

## 2020-11-25 DIAGNOSIS — E1122 Type 2 diabetes mellitus with diabetic chronic kidney disease: Secondary | ICD-10-CM | POA: Diagnosis not present

## 2020-11-25 DIAGNOSIS — I129 Hypertensive chronic kidney disease with stage 1 through stage 4 chronic kidney disease, or unspecified chronic kidney disease: Secondary | ICD-10-CM | POA: Diagnosis not present

## 2020-12-19 DIAGNOSIS — E538 Deficiency of other specified B group vitamins: Secondary | ICD-10-CM | POA: Diagnosis not present

## 2020-12-24 ENCOUNTER — Encounter: Payer: Self-pay | Admitting: Podiatry

## 2020-12-24 ENCOUNTER — Other Ambulatory Visit: Payer: Self-pay

## 2020-12-24 ENCOUNTER — Ambulatory Visit: Payer: Medicare Other | Admitting: Podiatry

## 2020-12-24 DIAGNOSIS — M79674 Pain in right toe(s): Secondary | ICD-10-CM | POA: Diagnosis not present

## 2020-12-24 DIAGNOSIS — B351 Tinea unguium: Secondary | ICD-10-CM | POA: Diagnosis not present

## 2020-12-24 DIAGNOSIS — E119 Type 2 diabetes mellitus without complications: Secondary | ICD-10-CM

## 2020-12-24 DIAGNOSIS — M79675 Pain in left toe(s): Secondary | ICD-10-CM

## 2020-12-24 NOTE — Progress Notes (Signed)
This patient returns to my office for at risk foot care.  This patient requires this care by a professional since this patient will be at risk due to having diabetes.  This patient is unable to cut nails himself since the patient cannot reach his nails.These nails are painful walking and wearing shoes.  This patient presents for at risk foot care today.  General Appearance  Alert, conversant and in no acute stress.  Vascular  Dorsalis pedis and posterior tibial  pulses are palpable  bilaterally.  Capillary return is within normal limits  bilaterally. Temperature is within normal limits  bilaterally.  Neurologic  Senn-Weinstein monofilament wire test within normal limits  bilaterally. Muscle power within normal limits bilaterally.  Nails Thick disfigured discolored nails with subungual debris  from hallux to fifth toes bilaterally. No evidence of bacterial infection or drainage bilaterally.  Orthopedic  No limitations of motion  feet .  No crepitus or effusions noted.  No bony pathology or digital deformities noted.  Skin  normotropic skin with no porokeratosis noted bilaterally.  No signs of infections or ulcers noted.     Onychomycosis  Pain in right toes  Pain in left toes  Consent was obtained for treatment procedures.   Mechanical debridement of nails 1-5  bilaterally performed with a nail nipper.  Filed with dremel without incident.    Return office visit  12 weeks                    Told patient to return for periodic foot care and evaluation due to potential at risk complications.   Haidynn Almendarez DPM  

## 2020-12-25 DIAGNOSIS — E1122 Type 2 diabetes mellitus with diabetic chronic kidney disease: Secondary | ICD-10-CM | POA: Diagnosis not present

## 2020-12-25 DIAGNOSIS — I129 Hypertensive chronic kidney disease with stage 1 through stage 4 chronic kidney disease, or unspecified chronic kidney disease: Secondary | ICD-10-CM | POA: Diagnosis not present

## 2020-12-25 DIAGNOSIS — N183 Chronic kidney disease, stage 3 unspecified: Secondary | ICD-10-CM | POA: Diagnosis not present

## 2021-01-05 DIAGNOSIS — E1129 Type 2 diabetes mellitus with other diabetic kidney complication: Secondary | ICD-10-CM | POA: Diagnosis not present

## 2021-01-05 DIAGNOSIS — R809 Proteinuria, unspecified: Secondary | ICD-10-CM | POA: Diagnosis not present

## 2021-01-05 DIAGNOSIS — N189 Chronic kidney disease, unspecified: Secondary | ICD-10-CM | POA: Diagnosis not present

## 2021-01-05 DIAGNOSIS — E1122 Type 2 diabetes mellitus with diabetic chronic kidney disease: Secondary | ICD-10-CM | POA: Diagnosis not present

## 2021-01-05 DIAGNOSIS — E559 Vitamin D deficiency, unspecified: Secondary | ICD-10-CM | POA: Diagnosis not present

## 2021-01-08 ENCOUNTER — Other Ambulatory Visit: Payer: Self-pay | Admitting: *Deleted

## 2021-01-08 DIAGNOSIS — E559 Vitamin D deficiency, unspecified: Secondary | ICD-10-CM | POA: Diagnosis not present

## 2021-01-08 DIAGNOSIS — I129 Hypertensive chronic kidney disease with stage 1 through stage 4 chronic kidney disease, or unspecified chronic kidney disease: Secondary | ICD-10-CM | POA: Diagnosis not present

## 2021-01-08 DIAGNOSIS — E1122 Type 2 diabetes mellitus with diabetic chronic kidney disease: Secondary | ICD-10-CM | POA: Diagnosis not present

## 2021-01-08 DIAGNOSIS — E1129 Type 2 diabetes mellitus with other diabetic kidney complication: Secondary | ICD-10-CM | POA: Diagnosis not present

## 2021-01-08 DIAGNOSIS — R809 Proteinuria, unspecified: Secondary | ICD-10-CM | POA: Diagnosis not present

## 2021-01-08 DIAGNOSIS — D631 Anemia in chronic kidney disease: Secondary | ICD-10-CM | POA: Diagnosis not present

## 2021-01-08 DIAGNOSIS — N189 Chronic kidney disease, unspecified: Secondary | ICD-10-CM | POA: Diagnosis not present

## 2021-01-08 NOTE — Patient Outreach (Signed)
[facility]) Care Management  01/08/2021  MIKEL HARDGROVE December 25, 1950 [id number]  Unsuccessful outreach attempt made to patient. Patient answered the phone and stated that he was driving. Nurse stated for safety reasons she would call him back at a later date.  Patient did agree to a callback at a later date.  Plan: RN Health Coach will call patient within the month of August.  Emelia Loron RN, BSN [place] [phone] [email or web address]

## 2021-01-15 DIAGNOSIS — T466X5A Adverse effect of antihyperlipidemic and antiarteriosclerotic drugs, initial encounter: Secondary | ICD-10-CM | POA: Diagnosis not present

## 2021-01-21 DIAGNOSIS — L720 Epidermal cyst: Secondary | ICD-10-CM | POA: Diagnosis not present

## 2021-01-25 DIAGNOSIS — N183 Chronic kidney disease, stage 3 unspecified: Secondary | ICD-10-CM | POA: Diagnosis not present

## 2021-01-25 DIAGNOSIS — I129 Hypertensive chronic kidney disease with stage 1 through stage 4 chronic kidney disease, or unspecified chronic kidney disease: Secondary | ICD-10-CM | POA: Diagnosis not present

## 2021-01-25 DIAGNOSIS — E1122 Type 2 diabetes mellitus with diabetic chronic kidney disease: Secondary | ICD-10-CM | POA: Diagnosis not present

## 2021-02-04 ENCOUNTER — Other Ambulatory Visit: Payer: Self-pay | Admitting: *Deleted

## 2021-02-04 NOTE — Patient Outreach (Signed)
[facility]) Care Management  [place]  02/04/2021   KERIM STATZER 1951/04/24 [id number]  Subjective: Successful telephone outreach call to patient. HIPAA identifiers obtained. Patient states he is dong well at this time. Nurse discussed with patient his health goals and his health and wellness needs which were documented in the Epic system. Patient did not have any further questions or concerns today and did confirm that she has this nurse's contact number to call her if needed.   Encounter Medications:  Outpatient Encounter Medications as of 02/04/2021  Medication Sig Note   ALPRAZolam (XANAX XR) 0.5 MG 24 hr tablet Take 0.5 mg by mouth daily.    amLODipine (NORVASC) 10 MG tablet Take 10 mg by mouth daily.    atorvastatin (LIPITOR) 10 MG tablet Take 10 mg by mouth daily.    Blood Glucose Monitoring Suppl ([place]) w/Device KIT See admin instructions.    chlorthalidone (HYGROTON) 25 MG tablet Take 25 mg by mouth daily.    ezetimibe (ZETIA) 10 MG tablet Take 10 mg by mouth daily.    gabapentin (NEURONTIN) 600 MG tablet Take 600 mg by mouth 3 (three) times daily. Two tabs at bedtime    glimepiride (AMARYL) 2 MG tablet     Lancets (ONETOUCH DELICA PLUS [id number]) MISC USE TO TEST ONCE DAILY    lisinopril (ZESTRIL) 20 MG tablet Take by mouth.    metoprolol (LOPRESSOR) 100 MG tablet Take 100 mg by mouth 2 (two) times daily.    ONETOUCH VERIO test strip USE TO TEST BLOOD SUGAR DAILY    pioglitazone (ACTOS) 30 MG tablet Take 30 mg by mouth daily.     azithromycin (ZITHROMAX) 250 MG tablet TAKE 2 TABLETS BY MOUTH TODAY, THEN TAKE 1 TABLET DAILY FOR 4 DAYS (Patient not taking: Reported on 02/04/2021) 02/04/2021: completed   [employer] COENZYME Q-10 100 MG capsule Take 100 mg by mouth daily. (Patient not taking: No sig reported) 10/24/2020: completed   doxycycline (VIBRAMYCIN) 100 MG capsule Take 100 mg by mouth 2 (two) times daily. (Patient not taking: No  sig reported) 10/24/2020: completed   fluconazole (DIFLUCAN) 150 MG tablet Take 150 mg by mouth daily. (Patient not taking: No sig reported) 10/24/2020: completed   MODERNA COVID-19 VACCINE 100 MCG/0.5ML injection  (Patient not taking: Reported on 02/04/2021) 02/04/2021: completed   pravastatin (PRAVACHOL) 10 MG tablet Take 10 mg by mouth daily. (Patient not taking: No sig reported) 10/24/2020: Stopped due to cramping symptoms   No facility-administered encounter medications on file as of 02/04/2021.    Functional Status:  No flowsheet data found.  Fall/Depression Screening: Fall Risk  02/04/2021 10/24/2020 05/09/2020  Falls in the past year? 0 0 0  Number falls in past yr: 0 0 0  Injury with Fall? 0 0 0  Follow up Falls evaluation completed Falls prevention discussed;Education provided;Falls evaluation completed Falls evaluation completed   PHQ 2/9 Scores 03/20/2020 06/11/2019  PHQ - 2 Score 0 0    Assessment:   Care Plan Care Plan : Hypertension (Adult)  Updates made by Michiel Cowboy, RN since 02/04/2021 12:00 AM     Problem: Hypertension (Hypertension)   Priority: Medium     Long-Range Goal: Hypertension Monitored   Start Date: 02/04/2021  Expected End Date: 02/24/2022  Note:   Evidence-based guidance:  Promote initial use of ambulatory blood pressure measurements (for 3 days) to rule out "white-coat" effect; identify masked hypertension and presence or absence of nocturnal "dipping" of blood  pressure.   Encourage continued use of home blood pressure monitoring and recording in blood pressure log; include symptoms of hypotension or potential medication side effects in log.  Review blood pressure measurements taken inside and outside of the provider office; establish baseline and monitor trends; compare to target ranges or patient goal.  Share overall cardiovascular risk with patient; encourage changes to lifestyle risk factors, including alcohol consumption, smoking, inadequate  exercise, poor dietary habits and stress.   Notes:     Task: Identify and Monitor Blood Pressure Elevation   Due Date: 02/24/2022  Note:   Care Management Activities:    - blood pressure trends reviewed - home or ambulatory blood pressure monitoring encouraged    Notes:     Problem: Disease Progression (Hypertension)   Priority: Medium     Long-Range Goal: Disease Progression Prevented or Minimized   Start Date: 02/04/2021  Expected End Date: 02/24/2022  Note:   Evidence-based guidance:  Tailor lifestyle advice to individual; review progress regularly; give frequent encouragement and respond positively to incremental successes.  Assess for and promote awareness of worsening disease or development of comorbidity.  Prepare patient for laboratory and diagnostic exams based on risk and presentation.  Prepare patient for use of pharmacologic therapy that may include diuretic, beta-blocker, beta-blocker/thiazide combination, angiotensin-converting enzyme inhibitor, renin-angiotensin blocker or calcium-channel blocker.  Expect periodic adjustments to pharmacologic therapy; manage side effects.  Promote a healthy diet that includes primarily plant-based foods, such as fruits, vegetables, whole grains, beans and legumes, low-fat dairy and lean meats.   Consider moderate reduction in sodium intake by avoiding the addition of salt to prepared foods and limiting processed meats, canned soup, frozen meals and salty snacks.   Promote a regular, daily exercise goal of 150 minutes per week of moderate exercise based on tolerance, ability and patient choice; consider referral to physical therapist, community wellness and/or activity program.  Encourage the avoidance of no more than 2 hours per day of sedentary activity, such as recreational screen time.  Review sources of stress; explore current coping strategies and encourage use of mindfulness, yoga, meditation or exercise to manage stress.   Notes:      Task: Alleviate Barriers to Hypertension Treatment   Due Date: 02/24/2022  Note:   Care Management Activities:    - healthy diet promoted - healthy family lifestyle promoted - medical nutrition therapy provided - medication side effects managed - reduction of dietary sodium encouraged - reduction in sedentary activities encouraged    Notes:       Goals Addressed             This Visit's Progress    [facility]) Obtain Eye Exam-Diabetes Type 2       Timeframe:  Long-Range Goal Priority:  Medium Start Date:  02/04/21                           Expected End Date: 05/27/21                    Follow Up Date 05/27/21    - schedule appointment with eye doctor    Why is this important?   Eye check-ups are important when you have diabetes.  Vision loss can be prevented.    Notes: 02/04/21: Nurse provided education regarding diabetes, eye health, and vision loss prevention.     COMPLETED: [facility]) Patient will maintain A1c of 6.8 or below within the next 90 days  CARE PLAN ENTRY (see longitudinal plan of care for additional care plan information)  Objective:  No results found for: HGBA1C No results found for: CREATININE No results found for: EGFR  Current Barriers:  Knowledge Deficits related to basic Diabetes pathophysiology and self care/management  Case Manager Clinical Goal(s):  Over the next 90 days, patient will demonstrate improved adherence to prescribed treatment plan for diabetes self care/management as evidenced by:  Verbalize daily monitoring and recording of CBG within 90 days Verbalize adherence to ADA/ carb modified diet within the next 90 days Verbalize exercise 1-2 days/week Verbalize adherence to prescribed medication regimen within the next 90 days   Interventions:  Provided education to patient about basic DM disease process Reviewed medications with patient and discussed importance of medication adherence Discussed plans with patient for ongoing  care management follow up and provided patient with direct contact information for care management team Advised patient, providing education and rationale, to check cbg daily and record, calling doctor for findings outside established parameters.   Nurse will send EMMI weight loss tips, Advance Directive documents, and planning healthy meals booklet. Nurse will send a calendar booklet for the patient to record his blood sugar values  Patient Self Care Activities:  Self administers oral medications as prescribed Attends all scheduled provider appointments Checks blood sugars as prescribed and utilize hyper and hypoglycemia protocol as needed Adheres to prescribed ADA/carb modified Patient states he would like to start exercising and he will look into joining [employer]. Patient monitors his B/P routinely  Please see past updates related to this goal by clicking on the "Past Updates" button in the selected goal  Updated: 05/09/20  Timeframe:  Long-Range Goal Priority:  High Start Date: 03/20/20                           Expected End Date: 03/27/21 Follow Up Date: 01/24/21  Updated 10/24/20:  Patient continues to monitor his blood sugar daily. Nurse will send a calendar booklet for the patient to record his values. Patient states he will look into joining the [employer]               Resolved due to duplicate goals 9/83/38     [facility]) Patient will verbalize coninuation on monitoring his blood sugar daily and recording the values for the next 90 days   On track    Timeframe:  Long-Range Goal Priority:  Medium Start Date: 05/09/20                            Expected End Date: 05/27/21                      Follow Up Date 05/27/21   - check blood sugar at prescribed times - check blood sugar if I feel it is too high or too low - enter blood sugar readings and medication or insulin into daily log - take the blood sugar log to all doctor visits - take the blood sugar meter to  all doctor visits  -Encouraged continuation of limiting sugar and carbohydrates in his diet -Encouraged increasing patient's physical activity as tolerated -Discussed joining the [employer]  Why is this important?   Checking your blood sugar at home helps to keep it from getting very high or very low.  Writing the results in a diary or log helps the doctor know how  to care for you.  Your blood sugar log should have the time, date and the results.  Also, write down the amount of insulin or other medicine that you take.  Other information, like what you ate, exercise done and how you were feeling, will also be helpful.     Notes: Updated 02/04/21: Patient states he monitors his FBS daily and today's value was 118. Patient's last A1c was 6.4 and he reports that his blood sugar and diabetes are currently under control.  Updated 10/24/20: Patient continues to monitor his blood sugar daily. Nurse will send a calendar booklet for the patient to record his values.     [facility]) Patient will verbalize continuation of A1c below 6.8 for the next 90 days   On track    Follow Up Date 05/27/21    - set target A1C    Why is this important?   Your target A1C is decided together by you and your doctor.  It is based on several things like your age and other health issues.    Notes: Updated 02/04/21: Patient's A1c is 6.4     [facility]) Patient will verbalize continuation of monitoring his B/P daily and and will begin to record the values daily within the next 90 days   On track    Timeframe:  Long-Range Goal Priority:  High Start Date: 10/24/20                            Expected End Date: 10/24/21                      Follow Up Date 05/27/21    - check blood pressure daily - write blood pressure results in a log or diary  -Discussed limiting salt intake -Encouraged continuation of reading food labels -Discussed increasing physical activity as tolerated -Encouraged patient to write his B/P values  down daily  Why is this important?   You won't feel high blood pressure, but it can still hurt your blood vessels.  High blood pressure can cause heart or kidney problems. It can also cause a stroke.  Making lifestyle changes like losing a little weight or eating less salt will help.  Checking your blood pressure at home and at different times of the day can help to control blood pressure.  If the doctor prescribes medicine remember to take it the way the doctor ordered.  Call the office if you cannot afford the medicine or if there are questions about it.     Updated 02/04/21: Patient states that he takes his B/P most days and that his values have been good. Patient reports that he did receive the [facility] calendar booklet and hypertension education and he does not have any questions about the material. Patient has maintained his 8 pound weight loss and states that he does plan to join [employer]. Patient reports increasing his water intake and explains he mainly drinks water to stay hydrated.   Notes: Patient reports taking his blood pressure daily and shared that his values have improved with systolic ranges of 419-379'K. Nurse will send hypertension education and a calendar booklet for the patient to record his B/P values. Patient states that he will look into joining the [place]: [place] will send PCP a quarterly update, will send patient Advance Directive documents, and will call patient within the month of  November. Follow-up: Patient agrees to Care Plan and Follow-up.  Emelia Loron RN, BSN [place] [phone] [email or web address]

## 2021-02-04 NOTE — Patient Instructions (Addendum)
Goals Addressed             This Visit's Progress    [facility]) Obtain Eye Exam-Diabetes Type 2       Timeframe:  Long-Range Goal Priority:  Medium Start Date:  02/04/21                           Expected End Date: 05/27/21                    Follow Up Date 05/27/21    - schedule appointment with eye doctor    Why is this important?   Eye check-ups are important when you have diabetes.  Vision loss can be prevented.    Notes: 02/04/21: Nurse provided education regarding diabetes, eye health, and vision loss prevention.     COMPLETED: [facility]) Patient will maintain A1c of 6.8 or below within the next 90 days       [place] (see longitudinal plan of care for additional care plan information)  Objective:  No results found for: HGBA1C No results found for: CREATININE No results found for: EGFR  Current Barriers:  Knowledge Deficits related to basic Diabetes pathophysiology and self care/management  Case Manager Clinical Goal(s):  Over the next 90 days, patient will demonstrate improved adherence to prescribed treatment plan for diabetes self care/management as evidenced by:  Verbalize daily monitoring and recording of CBG within 90 days Verbalize adherence to ADA/ carb modified diet within the next 90 days Verbalize exercise 1-2 days/week Verbalize adherence to prescribed medication regimen within the next 90 days   Interventions:  Provided education to patient about basic DM disease process Reviewed medications with patient and discussed importance of medication adherence Discussed plans with patient for ongoing care management follow up and provided patient with direct contact information for care management team Advised patient, providing education and rationale, to check cbg daily and record, calling doctor for findings outside established parameters.   Nurse will send EMMI weight loss tips, Advance Directive documents, and planning healthy meals booklet. Nurse will send  a calendar booklet for the patient to record his blood sugar values  Patient Self Care Activities:  Self administers oral medications as prescribed Attends all scheduled provider appointments Checks blood sugars as prescribed and utilize hyper and hypoglycemia protocol as needed Adheres to prescribed ADA/carb modified Patient states he would like to start exercising and he will look into joining [employer]. Patient monitors his B/P routinely  Please see past updates related to this goal by clicking on the "Past Updates" button in the selected goal  Updated: 05/09/20  Timeframe:  Long-Range Goal Priority:  High Start Date: 03/20/20                           Expected End Date: 03/27/21 Follow Up Date: 01/24/21  Updated 10/24/20:  Patient continues to monitor his blood sugar daily. Nurse will send a calendar booklet for the patient to record his values. Patient states he will look into joining the [employer]               Resolved due to duplicate goals 10/20/93     [facility]) Patient will verbalize coninuation on monitoring his blood sugar daily and recording the values for the next 90 days   On track    Timeframe:  Long-Range Goal Priority:  Medium Start Date: 05/09/20  Expected End Date: 05/27/21                      Follow Up Date 05/27/21   - check blood sugar at prescribed times - check blood sugar if I feel it is too high or too low - enter blood sugar readings and medication or insulin into daily log - take the blood sugar log to all doctor visits - take the blood sugar meter to all doctor visits  -Encouraged continuation of limiting sugar and carbohydrates in his diet -Encouraged increasing patient's physical activity as tolerated -Discussed joining the [employer]  Why is this important?   Checking your blood sugar at home helps to keep it from getting very high or very low.  Writing the results in a diary or log helps the  doctor know how to care for you.  Your blood sugar log should have the time, date and the results.  Also, write down the amount of insulin or other medicine that you take.  Other information, like what you ate, exercise done and how you were feeling, will also be helpful.     Notes: Updated 02/04/21: Patient states he monitors his FBS daily and today's value was 118. Patient's last A1c was 6.4 and he reports that his blood sugar and diabetes are currently under control.  Updated 10/24/20: Patient continues to monitor his blood sugar daily. Nurse will send a calendar booklet for the patient to record his values.     [facility]) Patient will verbalize continuation of A1c below 6.8 for the next 90 days   On track    Follow Up Date 05/27/21    - set target A1C    Why is this important?   Your target A1C is decided together by you and your doctor.  It is based on several things like your age and other health issues.    Notes: Updated 02/04/21: Patient's A1c is 6.4     [facility]) Patient will verbalize continuation of monitoring his B/P daily and and will begin to record the values daily within the next 90 days   On track    Timeframe:  Long-Range Goal Priority:  High Start Date: 10/24/20                            Expected End Date: 10/24/21                      Follow Up Date 05/27/21    - check blood pressure daily - write blood pressure results in a log or diary  -Discussed limiting salt intake -Encouraged continuation of reading food labels -Discussed increasing physical activity as tolerated -Encouraged patient to write his B/P values down daily  Why is this important?   You won't feel high blood pressure, but it can still hurt your blood vessels.  High blood pressure can cause heart or kidney problems. It can also cause a stroke.  Making lifestyle changes like losing a little weight or eating less salt will help.  Checking your blood pressure at home and at different times of the day can help  to control blood pressure.  If the doctor prescribes medicine remember to take it the way the doctor ordered.  Call the office if you cannot afford the medicine or if there are questions about it.     Updated 02/04/21: Patient states that he takes his B/P most  days and that his values have been good. Patient reports that he did receive the [facility] calendar booklet and hypertension education and he does not have any questions about the material. Patient has maintained his 8 pound weight loss and states that he does plan to join [employer]. Patient reports increasing his water intake and explains he mainly drinks water to stay hydrated.   Notes: Patient reports taking his blood pressure daily and shared that his values have improved with systolic ranges of 286-751'T. Nurse will send hypertension education and a calendar booklet for the patient to record his B/P values. Patient states that he will look into joining the [employer]

## 2021-02-17 DIAGNOSIS — E538 Deficiency of other specified B group vitamins: Secondary | ICD-10-CM | POA: Diagnosis not present

## 2021-02-25 DIAGNOSIS — E1122 Type 2 diabetes mellitus with diabetic chronic kidney disease: Secondary | ICD-10-CM | POA: Diagnosis not present

## 2021-02-25 DIAGNOSIS — N183 Chronic kidney disease, stage 3 unspecified: Secondary | ICD-10-CM | POA: Diagnosis not present

## 2021-02-25 DIAGNOSIS — I129 Hypertensive chronic kidney disease with stage 1 through stage 4 chronic kidney disease, or unspecified chronic kidney disease: Secondary | ICD-10-CM | POA: Diagnosis not present

## 2021-03-05 DIAGNOSIS — E119 Type 2 diabetes mellitus without complications: Secondary | ICD-10-CM | POA: Diagnosis not present

## 2021-03-05 DIAGNOSIS — E538 Deficiency of other specified B group vitamins: Secondary | ICD-10-CM | POA: Diagnosis not present

## 2021-03-05 DIAGNOSIS — R350 Frequency of micturition: Secondary | ICD-10-CM | POA: Diagnosis not present

## 2021-03-13 DIAGNOSIS — Z79899 Other long term (current) drug therapy: Secondary | ICD-10-CM | POA: Diagnosis not present

## 2021-03-13 DIAGNOSIS — E1129 Type 2 diabetes mellitus with other diabetic kidney complication: Secondary | ICD-10-CM | POA: Diagnosis not present

## 2021-03-13 DIAGNOSIS — E1122 Type 2 diabetes mellitus with diabetic chronic kidney disease: Secondary | ICD-10-CM | POA: Diagnosis not present

## 2021-03-13 DIAGNOSIS — E559 Vitamin D deficiency, unspecified: Secondary | ICD-10-CM | POA: Diagnosis not present

## 2021-03-13 DIAGNOSIS — D519 Vitamin B12 deficiency anemia, unspecified: Secondary | ICD-10-CM | POA: Diagnosis not present

## 2021-03-13 DIAGNOSIS — R809 Proteinuria, unspecified: Secondary | ICD-10-CM | POA: Diagnosis not present

## 2021-03-13 DIAGNOSIS — N189 Chronic kidney disease, unspecified: Secondary | ICD-10-CM | POA: Diagnosis not present

## 2021-03-17 DIAGNOSIS — E1122 Type 2 diabetes mellitus with diabetic chronic kidney disease: Secondary | ICD-10-CM | POA: Diagnosis not present

## 2021-03-17 DIAGNOSIS — N170 Acute kidney failure with tubular necrosis: Secondary | ICD-10-CM | POA: Diagnosis not present

## 2021-03-17 DIAGNOSIS — I129 Hypertensive chronic kidney disease with stage 1 through stage 4 chronic kidney disease, or unspecified chronic kidney disease: Secondary | ICD-10-CM | POA: Diagnosis not present

## 2021-03-17 DIAGNOSIS — E1129 Type 2 diabetes mellitus with other diabetic kidney complication: Secondary | ICD-10-CM | POA: Diagnosis not present

## 2021-03-17 DIAGNOSIS — N189 Chronic kidney disease, unspecified: Secondary | ICD-10-CM | POA: Diagnosis not present

## 2021-03-17 DIAGNOSIS — R809 Proteinuria, unspecified: Secondary | ICD-10-CM | POA: Diagnosis not present

## 2021-03-27 ENCOUNTER — Ambulatory Visit: Payer: Medicare Other | Admitting: Podiatry

## 2021-03-27 ENCOUNTER — Other Ambulatory Visit: Payer: Self-pay

## 2021-03-27 ENCOUNTER — Encounter: Payer: Self-pay | Admitting: Podiatry

## 2021-03-27 DIAGNOSIS — M79675 Pain in left toe(s): Secondary | ICD-10-CM | POA: Diagnosis not present

## 2021-03-27 DIAGNOSIS — M79674 Pain in right toe(s): Secondary | ICD-10-CM | POA: Diagnosis not present

## 2021-03-27 DIAGNOSIS — B351 Tinea unguium: Secondary | ICD-10-CM

## 2021-03-27 DIAGNOSIS — E119 Type 2 diabetes mellitus without complications: Secondary | ICD-10-CM | POA: Diagnosis not present

## 2021-03-27 NOTE — Progress Notes (Signed)
This patient returns to my office for at risk foot care.  This patient requires this care by a professional since this patient will be at risk due to having diabetes.  This patient is unable to cut nails himself since the patient cannot reach his nails.These nails are painful walking and wearing shoes.  This patient presents for at risk foot care today.  General Appearance  Alert, conversant and in no acute stress.  Vascular  Dorsalis pedis and posterior tibial  pulses are palpable  bilaterally.  Capillary return is within normal limits  bilaterally. Temperature is within normal limits  bilaterally.  Neurologic  Senn-Weinstein monofilament wire test within normal limits  bilaterally. Muscle power within normal limits bilaterally.  Nails Thick disfigured discolored nails with subungual debris  from hallux to fifth toes bilaterally. No evidence of bacterial infection or drainage bilaterally.  Orthopedic  No limitations of motion  feet .  No crepitus or effusions noted.  No bony pathology or digital deformities noted.  Skin  normotropic skin with no porokeratosis noted bilaterally.  No signs of infections or ulcers noted.     Onychomycosis  Pain in right toes  Pain in left toes  Consent was obtained for treatment procedures.   Mechanical debridement of nails 1-5  bilaterally performed with a nail nipper.  Filed with dremel without incident.    Return office visit  12 weeks                    Told patient to return for periodic foot care and evaluation due to potential at risk complications.   Joyice Magda DPM  

## 2021-04-08 DIAGNOSIS — Z23 Encounter for immunization: Secondary | ICD-10-CM | POA: Diagnosis not present

## 2021-04-08 DIAGNOSIS — E538 Deficiency of other specified B group vitamins: Secondary | ICD-10-CM | POA: Diagnosis not present

## 2021-04-10 DIAGNOSIS — N170 Acute kidney failure with tubular necrosis: Secondary | ICD-10-CM | POA: Diagnosis not present

## 2021-04-10 DIAGNOSIS — N189 Chronic kidney disease, unspecified: Secondary | ICD-10-CM | POA: Diagnosis not present

## 2021-04-10 DIAGNOSIS — E1122 Type 2 diabetes mellitus with diabetic chronic kidney disease: Secondary | ICD-10-CM | POA: Diagnosis not present

## 2021-04-10 DIAGNOSIS — E1129 Type 2 diabetes mellitus with other diabetic kidney complication: Secondary | ICD-10-CM | POA: Diagnosis not present

## 2021-04-10 DIAGNOSIS — R809 Proteinuria, unspecified: Secondary | ICD-10-CM | POA: Diagnosis not present

## 2021-04-14 DIAGNOSIS — E1129 Type 2 diabetes mellitus with other diabetic kidney complication: Secondary | ICD-10-CM | POA: Diagnosis not present

## 2021-04-14 DIAGNOSIS — N170 Acute kidney failure with tubular necrosis: Secondary | ICD-10-CM | POA: Diagnosis not present

## 2021-04-14 DIAGNOSIS — I129 Hypertensive chronic kidney disease with stage 1 through stage 4 chronic kidney disease, or unspecified chronic kidney disease: Secondary | ICD-10-CM | POA: Diagnosis not present

## 2021-04-14 DIAGNOSIS — E1122 Type 2 diabetes mellitus with diabetic chronic kidney disease: Secondary | ICD-10-CM | POA: Diagnosis not present

## 2021-04-14 DIAGNOSIS — R809 Proteinuria, unspecified: Secondary | ICD-10-CM | POA: Diagnosis not present

## 2021-04-14 DIAGNOSIS — E559 Vitamin D deficiency, unspecified: Secondary | ICD-10-CM | POA: Diagnosis not present

## 2021-04-14 DIAGNOSIS — N189 Chronic kidney disease, unspecified: Secondary | ICD-10-CM | POA: Diagnosis not present

## 2021-04-27 DIAGNOSIS — I129 Hypertensive chronic kidney disease with stage 1 through stage 4 chronic kidney disease, or unspecified chronic kidney disease: Secondary | ICD-10-CM | POA: Diagnosis not present

## 2021-04-27 DIAGNOSIS — N183 Chronic kidney disease, stage 3 unspecified: Secondary | ICD-10-CM | POA: Diagnosis not present

## 2021-04-27 DIAGNOSIS — E1122 Type 2 diabetes mellitus with diabetic chronic kidney disease: Secondary | ICD-10-CM | POA: Diagnosis not present

## 2021-05-06 ENCOUNTER — Encounter: Payer: Self-pay | Admitting: *Deleted

## 2021-05-06 ENCOUNTER — Other Ambulatory Visit: Payer: Self-pay | Admitting: *Deleted

## 2021-05-06 NOTE — Patient Outreach (Signed)
Alex Morton) Care Management  05/07/2021  Alex Morton 05/31/1951 [id number]  [facility]) Care Management RN Health Coach Note   05/07/2021 Name:  Alex Morton MRN:  [id number] DOB:  1951-04-22  Subjective: Alex Morton is an 70 y.o. year old male who is a primary patient of [employer], MD. The care management team was consulted for assistance with care management and/or care coordination needs. Patient reports that he is doing well. He continues to monitor his blood sugar and B/P, adheres to a low sodium and diabetic diet, and states his diabetes and hypertension are currently under control. Last A1c 6.7.   RN Health Coach completed Telephone Visit today.   Objective:  Medications Reviewed Today     Reviewed by Alex Cowboy, RN (Registered Nurse) on 05/06/21 at [place] List Status: <None>   Medication Order Taking? Sig Documenting Provider Last Dose Status Informant  ALPRAZolam (XANAX XR) 0.5 MG 24 hr tablet [id number] Yes Take 0.5 mg by mouth daily. [provider] Taking Active   amLODipine (NORVASC) 10 MG tablet [id number] Yes Take 10 mg by mouth daily. [provider] Taking Active   atorvastatin (LIPITOR) 10 MG tablet [id number] Yes Take 10 mg by mouth daily. [provider] Taking Active   azithromycin (ZITHROMAX) 250 MG tablet [id number] No TAKE 2 TABLETS BY MOUTH TODAY, THEN TAKE 1 TABLET DAILY FOR 4 DAYS  Patient not taking: No sig reported   [provider] Not Taking Active            Med Note Laretta Alstrom, Elina Streng A   Wed Feb 04, 2021  1:19 PM) completed  Blood Glucose Monitoring Suppl (ONETOUCH VERIO FLEX SYSTEM) w/Device KIT [id number] Yes See admin instructions. [provider] Taking Active   chlorthalidone (HYGROTON) 25 MG tablet [id number] Yes Take 25 mg by mouth daily. [provider] Taking Active   [employer] COENZYME Q-10 100 MG capsule [id number] No Take 100 mg by mouth  daily.  Patient not taking: No sig reported   [provider] Not Taking Active            Med Note Laretta Alstrom, Ardon Franklin A   Fri Oct 24, 2020  1:42 PM) completed  doxycycline (VIBRAMYCIN) 100 MG capsule [id number] No Take 100 mg by mouth 2 (two) times daily.  Patient not taking: No sig reported   [provider] Not Taking Active            Med Note Laretta Alstrom, Elanore Talcott A   Fri Oct 24, 2020  1:42 PM) completed  ezetimibe (ZETIA) 10 MG tablet [id number] Yes Take 10 mg by mouth daily. [provider] Taking Active   fluconazole (DIFLUCAN) 150 MG tablet [id number] No Take 150 mg by mouth daily.  Patient not taking: No sig reported   [provider] Not Taking Active            Med Note Laretta Alstrom, Reighan Hipolito A   Fri Oct 24, 2020  1:42 PM) completed  gabapentin (NEURONTIN) 600 MG tablet [id number] Yes Take 600 mg by mouth 3 (three) times daily. Two tabs at bedtime [provider] Taking Active   glimepiride (AMARYL) 2 MG tablet [id number] Yes  [provider] Taking Active   Lancets (ONETOUCH DELICA PLUS [id number]) [place] [id number] Yes USE TO TEST ONCE DAILY [provider] Taking Active   lisinopril (ZESTRIL) 20 MG tablet [id number]  Take by mouth. [provider]  Expired 03/05/21 2359  metoprolol (LOPRESSOR) 100 MG tablet [id number] Yes Take 100 mg by mouth 2 (two) times daily. [provider] Taking Active   MODERNA COVID-19 VACCINE 100 MCG/0.5ML injection [id number] No   Patient not taking: No sig reported   [provider] Not Taking Active            Med Note Laretta Alstrom, Abraham Margulies A   Wed Feb 04, 2021  1:20 PM) completed  [facility] VERIO test strip [id number] Yes USE TO TEST BLOOD SUGAR DAILY [provider] Taking Active   pioglitazone (ACTOS) 30 MG tablet [id number] Yes Take 30 mg by mouth daily.  [provider] Taking Active   pravastatin (PRAVACHOL) 10 MG tablet [id number] No Take 10 mg by mouth daily.  Patient not taking: No sig  reported   [provider] Not Taking Active            Med Note Laretta Alstrom, Cooper Stamp A   Wed May 06, 2021  4:08 PM) Stopped due to cramping             SDOH:  (Social Determinants of Health) assessments and interventions performed: Were reviewed and documented in the Epic system.  Care Plan  Review of patient past medical history, allergies, medications, health status, including review of consultants reports, laboratory and other test data, was performed as part of comprehensive evaluation for care management services.   Care Plan : Hypertension (Adult)  Updates made by Alex Cowboy, RN since 05/07/2021 12:00 AM     Problem: Hypertension (Hypertension)   Priority: Medium     Long-Range Goal: Hypertension Monitored Completed 05/06/2021  Start Date: 02/04/2021  Expected End Date: 02/24/2022  Note:   Resolving due to duplicate goal  Evidence-based guidance:  Promote initial use of ambulatory blood pressure measurements (for 3 days) to rule out "white-coat" effect; identify masked hypertension and presence or absence of nocturnal "dipping" of blood pressure.   Encourage continued use of home blood pressure monitoring and recording in blood pressure log; include symptoms of hypotension or potential medication side effects in log.  Review blood pressure measurements taken inside and outside of the provider office; establish baseline and monitor trends; compare to target ranges or patient goal.  Share overall cardiovascular risk with patient; encourage changes to lifestyle risk factors, including alcohol consumption, smoking, inadequate exercise, poor dietary habits and stress.   Notes:     Task: Identify and Monitor Blood Pressure Elevation Completed 05/07/2021  Due Date: 02/24/2022  Note:   Care Management Activities:    - blood pressure trends reviewed - home or ambulatory blood pressure monitoring encouraged    Notes:     Problem: Disease Progression (Hypertension)    Priority: Medium     Long-Range Goal: Disease Progression Prevented or Minimized Completed 05/06/2021  Start Date: 02/04/2021  Expected End Date: 02/24/2022  Note:   Resolving due to duplicate goal  Evidence-based guidance:  Tailor lifestyle advice to individual; review progress regularly; give frequent encouragement and respond positively to incremental successes.  Assess for and promote awareness of worsening disease or development of comorbidity.  Prepare patient for laboratory and diagnostic exams based on risk and presentation.  Prepare patient for use of pharmacologic therapy that may include diuretic, beta-blocker, beta-blocker/thiazide combination, angiotensin-converting enzyme inhibitor, renin-angiotensin blocker or calcium-channel blocker.  Expect periodic adjustments to pharmacologic therapy; manage side effects.  Promote a healthy diet that includes primarily plant-based foods, such as fruits, vegetables, whole grains, beans and legumes, low-fat dairy and lean meats.  Consider moderate reduction in sodium intake by avoiding the addition of salt to prepared foods and limiting processed meats, canned soup, frozen meals and salty snacks.   Promote a regular, daily exercise goal of 150 minutes per week of moderate exercise based on tolerance, ability and patient choice; consider referral to physical therapist, community wellness and/or activity program.  Encourage the avoidance of no more than 2 hours per day of sedentary activity, such as recreational screen time.  Review sources of stress; explore current coping strategies and encourage use of mindfulness, yoga, meditation or exercise to manage stress.   Notes:     Task: Alleviate Barriers to Hypertension Treatment Completed 05/07/2021  Due Date: 02/24/2022  Note:   Care Management Activities:    - healthy diet promoted - healthy family lifestyle promoted - medical nutrition therapy provided - medication side effects  managed - reduction of dietary sodium encouraged - reduction in sedentary activities encouraged    Notes:     Care Plan : [place] [facility] of Care  Updates made by Alex Cowboy, RN since 05/07/2021 12:00 AM     Problem: Knowledge Deficit Related to Diabetes and Hypertension   Priority: High     Long-Range Goal: Development of Plan of Care for Management of Diabetes and Hypertension   Start Date: 05/06/2021  Expected End Date: 05/27/2022  Priority: High  Note:   Current Barriers:  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  RNCM Clinical Goal(s): On Track Patient will verbalize basic understanding of HTN and DMII disease process and self health management plan as evidenced by verbalizing knowledge that taking his medications as prescribed, adhering to  a low sodium and diabetic diet, and monitoring his blood sugar daily and B/P  3 times weekly and recording the values is an important part of treating his diabetes and hypertension  take all medications exactly as prescribed and will call provider for medication related questions as evidenced by patient verbalizing being compliant with taking his medications as prescribed is an important part of treating his diabetes and hypertension.     demonstrate ongoing health management independence as evidenced by continuation of monitoring his blood sugar daily and B/P 3 times weekly and recording values, taking his medications as prescribed, and adhering to a low sodium and diabetic diet        continue to work with Consulting civil engineer and/or Social Worker to address care management and care coordination needs related to HTN and DMII as evidenced by adherence to CM Team Scheduled appointments     through collaboration with Consulting civil engineer, provider, and care team.  Patient will verbalize beginning to increase his physical activity as evidenced by joining [employer] and starting to ride the stationary bike  as discussed Patient will verbalize maintaining or losing weight as evidenced by continuation of making healthier food choices and by losing 2-3 pounds within the next 90 days Patient will call and make an eye appointment as evidenced by patient verbalizing that he has a scheduled appointment   Interventions: Inter-disciplinary care team collaboration (see longitudinal plan of care) Evaluation of current treatment plan related to  self management and patient's adherence to plan as established by provider Nurse provided education regarding hypertension and diabetes and education has previously been sent to patient Nurse discussed the importance of annual eye exams and provided education regarding retinopathy and vision loss Nurse discussed importance of maintaining a low sodium, low sugar, and  low carbohydrate diet Nurse will send patient education regarding weight loss tips and calorie diet Nurse encouraged patient to increase his physical activity to help control his hypertension and diabetes and to maintain his strength and mobility   Patient Goals/Self-Care Activities: Patient will self administer medications as prescribed as evidenced by self report/primary caregiver report  Patient will attend all scheduled provider appointments as evidenced by clinician review of documented attendance to scheduled appointments and patient/caregiver report Patient will join [employer] and begin to ride stationary bike to increase his physical activity schedule appointment with eye doctor check blood sugar at prescribed times: once daily check feet daily for cuts, sores or redness enter blood sugar readings and medication or insulin into daily log take the blood sugar log to all doctor visits set goal weight drink 6 to 8 glasses of water each day limit fast food meals to no more than 1 per week manage portion size read food labels for fat, fiber, carbohydrates and portion size set a realistic  goal wear comfortable, well-fitting shoes - check blood pressure 3 times per week - write blood pressure results in a log or diary - take blood pressure log to all doctor appointments - call doctor for signs and symptoms of high blood pressure - take medications for blood pressure exactly as prescribed - begin an exercise program - eat more whole grains, fruits and vegetables, lean meats and healthy fats Patient will schedule an eye appointment as discussed         Plan: Telephone follow up appointment with care management team member scheduled for:  January . Nurse will send PCP a quarterly update.  Emelia Loron RN, [place] [phone] [email or web address]

## 2021-05-07 ENCOUNTER — Ambulatory Visit: Payer: Self-pay | Admitting: *Deleted

## 2021-05-07 NOTE — Patient Instructions (Signed)
Visit Information  Patient will self administer medications as prescribed as evidenced by self report/primary caregiver report  Patient will attend all scheduled provider appointments as evidenced by clinician review of documented attendance to scheduled appointments and patient/caregiver report Patient will join [employer] and begin to ride stationary bike to increase his physical activity schedule appointment with eye doctor check blood sugar at prescribed times: once daily check feet daily for cuts, sores or redness enter blood sugar readings and medication or insulin into daily log take the blood sugar log to all doctor visits set goal weight drink 6 to 8 glasses of water each day limit fast food meals to no more than 1 per week manage portion size read food labels for fat, fiber, carbohydrates and portion size set a realistic goal wear comfortable, well-fitting shoes - check blood pressure 3 times per week - write blood pressure results in a log or diary - take blood pressure log to all doctor appointments - call doctor for signs and symptoms of high blood pressure - take medications for blood pressure exactly as prescribed - begin an exercise program - eat more whole grains, fruits and vegetables, lean meats and healthy fats Patient will schedule an eye appointment as discussed   The patient verbalized understanding of instructions, educational materials, and care plan provided today and agreed to receive a mailed copy of patient instructions, educational materials, and care plan.   Telephone follow up appointment with care management team member scheduled [id number]  Emelia Loron RN, [place] [phone] [email or web address]

## 2021-05-25 DIAGNOSIS — K5900 Constipation, unspecified: Secondary | ICD-10-CM | POA: Diagnosis not present

## 2021-05-25 DIAGNOSIS — E538 Deficiency of other specified B group vitamins: Secondary | ICD-10-CM | POA: Diagnosis not present

## 2021-05-25 DIAGNOSIS — R058 Other specified cough: Secondary | ICD-10-CM | POA: Diagnosis not present

## 2021-05-27 DIAGNOSIS — N183 Chronic kidney disease, stage 3 unspecified: Secondary | ICD-10-CM | POA: Diagnosis not present

## 2021-05-27 DIAGNOSIS — I129 Hypertensive chronic kidney disease with stage 1 through stage 4 chronic kidney disease, or unspecified chronic kidney disease: Secondary | ICD-10-CM | POA: Diagnosis not present

## 2021-05-27 DIAGNOSIS — E1122 Type 2 diabetes mellitus with diabetic chronic kidney disease: Secondary | ICD-10-CM | POA: Diagnosis not present

## 2021-06-04 DIAGNOSIS — Z1211 Encounter for screening for malignant neoplasm of colon: Secondary | ICD-10-CM | POA: Diagnosis not present

## 2021-06-12 DIAGNOSIS — I129 Hypertensive chronic kidney disease with stage 1 through stage 4 chronic kidney disease, or unspecified chronic kidney disease: Secondary | ICD-10-CM | POA: Diagnosis not present

## 2021-06-12 DIAGNOSIS — N189 Chronic kidney disease, unspecified: Secondary | ICD-10-CM | POA: Diagnosis not present

## 2021-06-12 DIAGNOSIS — E1122 Type 2 diabetes mellitus with diabetic chronic kidney disease: Secondary | ICD-10-CM | POA: Diagnosis not present

## 2021-06-12 DIAGNOSIS — E559 Vitamin D deficiency, unspecified: Secondary | ICD-10-CM | POA: Diagnosis not present

## 2021-06-16 DIAGNOSIS — E1122 Type 2 diabetes mellitus with diabetic chronic kidney disease: Secondary | ICD-10-CM | POA: Diagnosis not present

## 2021-06-16 DIAGNOSIS — I129 Hypertensive chronic kidney disease with stage 1 through stage 4 chronic kidney disease, or unspecified chronic kidney disease: Secondary | ICD-10-CM | POA: Diagnosis not present

## 2021-06-16 DIAGNOSIS — E1129 Type 2 diabetes mellitus with other diabetic kidney complication: Secondary | ICD-10-CM | POA: Diagnosis not present

## 2021-06-16 DIAGNOSIS — R809 Proteinuria, unspecified: Secondary | ICD-10-CM | POA: Diagnosis not present

## 2021-06-16 DIAGNOSIS — N189 Chronic kidney disease, unspecified: Secondary | ICD-10-CM | POA: Diagnosis not present

## 2021-06-26 ENCOUNTER — Other Ambulatory Visit: Payer: Self-pay

## 2021-06-26 ENCOUNTER — Encounter: Payer: Self-pay | Admitting: Podiatry

## 2021-06-26 ENCOUNTER — Ambulatory Visit: Payer: Medicare Other | Admitting: Podiatry

## 2021-06-26 DIAGNOSIS — E119 Type 2 diabetes mellitus without complications: Secondary | ICD-10-CM

## 2021-06-26 DIAGNOSIS — M79674 Pain in right toe(s): Secondary | ICD-10-CM | POA: Diagnosis not present

## 2021-06-26 DIAGNOSIS — I129 Hypertensive chronic kidney disease with stage 1 through stage 4 chronic kidney disease, or unspecified chronic kidney disease: Secondary | ICD-10-CM | POA: Diagnosis not present

## 2021-06-26 DIAGNOSIS — B351 Tinea unguium: Secondary | ICD-10-CM

## 2021-06-26 DIAGNOSIS — M79675 Pain in left toe(s): Secondary | ICD-10-CM

## 2021-06-26 DIAGNOSIS — E1122 Type 2 diabetes mellitus with diabetic chronic kidney disease: Secondary | ICD-10-CM | POA: Diagnosis not present

## 2021-06-26 DIAGNOSIS — N183 Chronic kidney disease, stage 3 unspecified: Secondary | ICD-10-CM | POA: Diagnosis not present

## 2021-06-26 NOTE — Progress Notes (Signed)
This patient returns to my office for at risk foot care.  This patient requires this care by a professional since this patient will be at risk due to having diabetes.  This patient is unable to cut nails himself since the patient cannot reach his nails.These nails are painful walking and wearing shoes.  This patient presents for at risk foot care today.  General Appearance  Alert, conversant and in no acute stress.  Vascular  Dorsalis pedis and posterior tibial  pulses are palpable  bilaterally.  Capillary return is within normal limits  bilaterally. Temperature is within normal limits  bilaterally.  Neurologic  Senn-Weinstein monofilament wire test within normal limits  bilaterally. Muscle power within normal limits bilaterally.  Nails Thick disfigured discolored nails with subungual debris  from hallux to fifth toes bilaterally. No evidence of bacterial infection or drainage bilaterally.  Orthopedic  No limitations of motion  feet .  No crepitus or effusions noted.  No bony pathology or digital deformities noted.  Skin  normotropic skin with no porokeratosis noted bilaterally.  No signs of infections or ulcers noted.     Onychomycosis  Pain in right toes  Pain in left toes  Consent was obtained for treatment procedures.   Mechanical debridement of nails 1-5  bilaterally performed with a nail nipper.  Filed with dremel without incident.    Return office visit  12 weeks                    Told patient to return for periodic foot care and evaluation due to potential at risk complications.   Heyward Douthit DPM  

## 2021-07-03 DIAGNOSIS — E538 Deficiency of other specified B group vitamins: Secondary | ICD-10-CM | POA: Diagnosis not present

## 2021-08-05 ENCOUNTER — Other Ambulatory Visit: Payer: Self-pay | Admitting: *Deleted

## 2021-08-05 DIAGNOSIS — E1122 Type 2 diabetes mellitus with diabetic chronic kidney disease: Secondary | ICD-10-CM | POA: Diagnosis not present

## 2021-08-05 DIAGNOSIS — E1140 Type 2 diabetes mellitus with diabetic neuropathy, unspecified: Secondary | ICD-10-CM | POA: Diagnosis not present

## 2021-08-05 DIAGNOSIS — E538 Deficiency of other specified B group vitamins: Secondary | ICD-10-CM | POA: Diagnosis not present

## 2021-08-05 NOTE — Patient Outreach (Signed)
[facility]) Care Management  08/05/2021  TREMELL REIMERS 05-06-1951 [id number]  Unsuccessful outreach attempt made to patient. Patient answered the phone and stated that he would not be able to speak today. Patient did request that this nurse call back tomorrow after 1500.  Plan: RN Health Coach will call patient tomorrow after 1500.  Emelia Loron RN, BSN [place] [phone] [email or web address]

## 2021-08-06 ENCOUNTER — Ambulatory Visit: Payer: Self-pay | Admitting: *Deleted

## 2021-08-06 ENCOUNTER — Other Ambulatory Visit: Payer: Self-pay | Admitting: *Deleted

## 2021-08-07 NOTE — Patient Outreach (Signed)
[facility]) Care Management  08/07/2021  EMORI KAMAU 1951/01/30 [id number]  [facility]) Care Management RN Health Coach Note   08/07/2021 Name:  Alex Morton MRN:  [id number] DOB:  [birth year]  Summary: Patient states that he is doing well with controlling his diabetes and hypertension. He continues to take his blood sugar and B/P daily. Reports FBS ranges of 125-156, last A1c 7, and B/P reading of 130/66 which is about average for him. Patient states he continues to make healthier food choices and plans to join the [employer] to increase  his physical activity. Patient wants to continue to lose weight for his health. Nurse discussed downloading app's to help him count the calories, carbohydrates, sugar, and fat he consumes. Patient did not have any further questions or concerns today and did confirm that he has this nurse's contact number to call her if needed.   Recommendations/Changes made from today's visit: begin an exercise program - eat more whole grains, fruits and vegetables, lean meats and healthy fats Schedule an eye appointment; diabetes can cause eye damage called retinopathy  Download My Fitness Pal or My Plate App to your smart phone; the app's will help you track calories, carbohydrates, sugar, fat that  you consume  Subjective: Alex Morton is an 71 y.o. year old male who is a primary patient of [employer], MD. The care management team was consulted for assistance with care management and/or care coordination needs.    RN Health Coach completed Telephone Visit today.   Objective:  Medications Reviewed Today     Reviewed by Michiel Cowboy, RN (Registered Nurse) on 08/07/21 at [place] List Status: <None>   Medication Order Taking? Sig Documenting Provider Last Dose Status Informant  ALPRAZolam (XANAX XR) 0.5 MG 24 hr tablet [id number] Yes Take 0.5 mg by mouth daily. [provider] Taking Active    amLODipine (NORVASC) 10 MG tablet [id number] Yes Take 10 mg by mouth daily. [provider] Taking Active   atorvastatin (LIPITOR) 10 MG tablet [id number] Yes Take 10 mg by mouth daily. [provider] Taking Active   azithromycin (ZITHROMAX) 250 MG tablet [id number] No TAKE 2 TABLETS BY MOUTH TODAY, THEN TAKE 1 TABLET DAILY FOR 4 DAYS  Patient not taking: Reported on 02/04/2021   [provider] Not Taking Active            Med Note Laretta Alstrom, Goebel Hellums A   Wed Feb 04, 2021  1:19 PM) completed  Blood Glucose Monitoring Suppl (ONETOUCH VERIO FLEX SYSTEM) w/Device KIT [id number] Yes See admin instructions. [provider] Taking Active   chlorthalidone (HYGROTON) 25 MG tablet [id number] Yes Take 25 mg by mouth daily. [provider] Taking Active   [facility] COENZYME Q-10 100 MG capsule [id number] No Take 100 mg by mouth daily.  Patient not taking: Reported on 10/24/2020   [provider] Not Taking Active            Med Note Laretta Alstrom, Morenike Cuff A   Fri Oct 24, 2020  1:42 PM) completed  doxycycline (VIBRAMYCIN) 100 MG capsule [id number] No Take 100 mg by mouth 2 (two) times daily.  Patient not taking: Reported on 10/24/2020   [provider] Not Taking Active            Med Note Laretta Alstrom, Shirlean Berman A   Fri Oct 24, 2020  1:42 PM) completed  ezetimibe (ZETIA) 10 MG tablet [id number] Yes Take 10 mg by mouth daily.  [provider] Taking Active   fluconazole (DIFLUCAN) 150 MG tablet [id number] No Take 150 mg by mouth daily.  Patient not taking: Reported on 10/24/2020   [provider] Not Taking Active            Med Note Laretta Alstrom, Yuki Purves A   Fri Oct 24, 2020  1:42 PM) completed  gabapentin (NEURONTIN) 600 MG tablet [id number] Yes Take 600 mg by mouth 3 (three) times daily. Two tabs at bedtime [provider] Taking Active   glimepiride (AMARYL) 2 MG tablet [id number] Yes  [provider] Taking Active   Lancets (ONETOUCH DELICA PLUS [id number]) [place]  [id number] Yes USE TO TEST ONCE DAILY [provider] Taking Active   lisinopril (ZESTRIL) 20 MG tablet [id number]  Take by mouth. [provider]  Expired 03/05/21 2359   metoprolol (LOPRESSOR) 100 MG tablet [id number] Yes Take 100 mg by mouth 2 (two) times daily. [provider] Taking Active   MODERNA COVID-19 VACCINE 100 MCG/0.5ML injection [id number] No   Patient not taking: Reported on 02/04/2021   [provider] Not Taking Active            Med Note Laretta Alstrom, Sumner Boesch A   Wed Feb 04, 2021  1:20 PM) completed  [facility] VERIO test strip [id number] Yes USE TO TEST BLOOD SUGAR DAILY [provider] Taking Active   pioglitazone (ACTOS) 30 MG tablet [id number] Yes Take 30 mg by mouth daily.  [provider] Taking Active   pravastatin (PRAVACHOL) 10 MG tablet [id number] No Take 10 mg by mouth daily.  Patient not taking: Reported on 10/24/2020   [provider] Not Taking Active            Med Note Laretta Alstrom, Alva Broxson A   Wed May 06, 2021  4:08 PM) Stopped due to cramping             SDOH:  (Social Determinants of Health) assessments and interventions performed:    Care Plan  Review of patient past medical history, allergies, medications, health status, including review of consultants reports, laboratory and other test data, was performed as part of comprehensive evaluation for care management services.   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  RNCM Clinical Goal(s): On Track Patient will verbalize basic understanding of HTN  and DMII disease process and self health management plan as evidenced by verbalizing knowledge that taking his medications as prescribed, adhering to  a low sodium and diabetic diet, and monitoring his blood sugar daily and B/P  3 times weekly and recording the values is an important part of treating his diabetes and hypertension  take all medications exactly as prescribed and will call provider for medication related questions as evidenced by patient verbalizing being compliant with taking his medications as prescribed is an important part of treating his diabetes and hypertension.     demonstrate ongoing health management independence as evidenced by continuation of monitoring his blood sugar daily and B/P 3 times weekly and recording values, taking his medications as  prescribed, and adhering to a low sodium and diabetic diet        continue to work with Consulting civil engineer and/or Social Worker to address care management and care coordination needs related to HTN and DMII as evidenced by adherence to CM Team Scheduled appointments     through collaboration with Consulting civil engineer, provider, and care team.  Patient will verbalize beginning to increase his physical activity as evidenced by joining [employer] and starting to ride the stationary bike as discussed Patient will verbalize maintaining or losing weight as evidenced by continuation of making healthier food choices and by losing 2-3 pounds within the next 90 days Patient will call and make an eye appointment as evidenced by patient verbalizing that he has a scheduled appointment   Interventions: Inter-disciplinary care team collaboration (see longitudinal plan of care) Evaluation of current treatment plan related to  self management and patient's adherence to plan as established by provider Nurse provided education regarding hypertension and diabetes and education has previously been sent to patient Nurse discussed the importance of annual eye  exams and provided education regarding retinopathy and vision loss Nurse discussed importance of maintaining a low sodium, low sugar, and low carbohydrate diet Nurse encouraged continuation of taking blood sugar and B/P daily and recording the values Nurse will send patient education regarding calorie diet Apps MyFitnessPal and MyPlate Nurse encouraged patient to increase his physical activity to help control his hypertension and diabetes and to maintain his strength and mobility   Patient Goals/Self-Care Activities: Patient will self administer medications as prescribed as evidenced by self report/primary caregiver report  Patient will attend all scheduled provider appointments as evidenced by clinician review of documented attendance to scheduled appointments and patient/caregiver report Patient will join [employer] and begin to ride stationary bike to increase his physical activity schedule appointment with eye doctor check blood sugar at prescribed times: once daily check feet daily for cuts, sores or redness enter blood sugar readings and medication or insulin into daily log take the blood sugar log to all doctor visits set goal weight drink 6 to 8 glasses of water each day limit fast food meals to no more than 1 per week manage portion size read food labels for fat, fiber, carbohydrates and portion size set a realistic goal wear comfortable, well-fitting shoes - check blood pressure 3 times per week - write blood pressure results in a log or diary - take blood pressure log to all doctor appointments - call doctor for signs and symptoms of high blood pressure - take medications for blood pressure exactly as prescribed - begin an exercise program - eat more whole grains, fruits and vegetables, lean meats and healthy fats Schedule an eye appointment; diabetes can cause eye damage called retinopathy  Download My Fitness Pal or My Plate App to your smart phone; the app's will help  you track calories, carbohydrates, sugar, fat that  you consume        Plan: Telephone follow up appointment with care management team member scheduled for:  May. Nurse will send PCP today's assessment note.  Emelia Loron RN, [place] [phone] [email or web address]

## 2021-08-07 NOTE — Patient Instructions (Signed)
Visit Information  Thank you for taking time to visit with me today. Please don't hesitate to contact me if I can be of assistance to you before our next scheduled telephone appointment.  Following are the goals we discussed today:  Patient Goals/Self-Care Activities: Patient will self administer medications as prescribed as evidenced by self report/primary caregiver report  Patient will attend all scheduled provider appointments as evidenced by clinician review of documented attendance to scheduled appointments and patient/caregiver report Patient will join [employer] and begin to ride stationary bike to increase his physical activity schedule appointment with eye doctor check blood sugar at prescribed times: once daily check feet daily for cuts, sores or redness enter blood sugar readings and medication or insulin into daily log take the blood sugar log to all doctor visits set goal weight drink 6 to 8 glasses of water each day limit fast food meals to no more than 1 per week manage portion size read food labels for fat, fiber, carbohydrates and portion size set a realistic goal wear comfortable, well-fitting shoes - check blood pressure 3 times per week - write blood pressure results in a log or diary - take blood pressure log to all doctor appointments - call doctor for signs and symptoms of high blood pressure - take medications for blood pressure exactly as prescribed - begin an exercise program - eat more whole grains, fruits and vegetables, lean meats and healthy fats Schedule an eye appointment; diabetes can cause eye damage called retinopathy  Download My Fitness Pal or My Plate App to your smart phone; the app's will help you track calories, carbohydrates, sugar, fat that  you consume   The patient verbalized understanding of instructions, educational materials, and care plan provided today and agreed to receive a mailed copy of patient instructions, educational  materials, and care plan.   Telephone follow up appointment with care management team member scheduled for: May  Emelia Loron RN, [place] [phone] [email or web address]

## 2021-08-25 DIAGNOSIS — N183 Chronic kidney disease, stage 3 unspecified: Secondary | ICD-10-CM | POA: Diagnosis not present

## 2021-08-25 DIAGNOSIS — I129 Hypertensive chronic kidney disease with stage 1 through stage 4 chronic kidney disease, or unspecified chronic kidney disease: Secondary | ICD-10-CM | POA: Diagnosis not present

## 2021-08-25 DIAGNOSIS — E1122 Type 2 diabetes mellitus with diabetic chronic kidney disease: Secondary | ICD-10-CM | POA: Diagnosis not present

## 2021-09-14 DIAGNOSIS — E538 Deficiency of other specified B group vitamins: Secondary | ICD-10-CM | POA: Diagnosis not present

## 2021-09-16 DIAGNOSIS — I129 Hypertensive chronic kidney disease with stage 1 through stage 4 chronic kidney disease, or unspecified chronic kidney disease: Secondary | ICD-10-CM | POA: Diagnosis not present

## 2021-09-16 DIAGNOSIS — N189 Chronic kidney disease, unspecified: Secondary | ICD-10-CM | POA: Diagnosis not present

## 2021-09-16 DIAGNOSIS — E1122 Type 2 diabetes mellitus with diabetic chronic kidney disease: Secondary | ICD-10-CM | POA: Diagnosis not present

## 2021-09-16 DIAGNOSIS — R809 Proteinuria, unspecified: Secondary | ICD-10-CM | POA: Diagnosis not present

## 2021-09-17 DIAGNOSIS — R809 Proteinuria, unspecified: Secondary | ICD-10-CM | POA: Diagnosis not present

## 2021-09-17 DIAGNOSIS — I129 Hypertensive chronic kidney disease with stage 1 through stage 4 chronic kidney disease, or unspecified chronic kidney disease: Secondary | ICD-10-CM | POA: Diagnosis not present

## 2021-09-17 DIAGNOSIS — N189 Chronic kidney disease, unspecified: Secondary | ICD-10-CM | POA: Diagnosis not present

## 2021-09-17 DIAGNOSIS — E1129 Type 2 diabetes mellitus with other diabetic kidney complication: Secondary | ICD-10-CM | POA: Diagnosis not present

## 2021-09-17 DIAGNOSIS — E1122 Type 2 diabetes mellitus with diabetic chronic kidney disease: Secondary | ICD-10-CM | POA: Diagnosis not present

## 2021-09-28 ENCOUNTER — Encounter: Payer: Self-pay | Admitting: Podiatry

## 2021-09-28 ENCOUNTER — Ambulatory Visit: Payer: Medicare Other | Admitting: Podiatry

## 2021-09-28 DIAGNOSIS — M79674 Pain in right toe(s): Secondary | ICD-10-CM

## 2021-09-28 DIAGNOSIS — E119 Type 2 diabetes mellitus without complications: Secondary | ICD-10-CM

## 2021-09-28 DIAGNOSIS — M79675 Pain in left toe(s): Secondary | ICD-10-CM | POA: Diagnosis not present

## 2021-09-28 DIAGNOSIS — B351 Tinea unguium: Secondary | ICD-10-CM

## 2021-09-28 NOTE — Progress Notes (Signed)
This patient returns to my office for at risk foot care.  This patient requires this care by a professional since this patient will be at risk due to having diabetes.  This patient is unable to cut nails himself since the patient cannot reach his nails.These nails are painful walking and wearing shoes.  This patient presents for at risk foot care today.  General Appearance  Alert, conversant and in no acute stress.  Vascular  Dorsalis pedis and posterior tibial  pulses are palpable  bilaterally.  Capillary return is within normal limits  bilaterally. Temperature is within normal limits  bilaterally.  Neurologic  Senn-Weinstein monofilament wire test within normal limits  bilaterally. Muscle power within normal limits bilaterally.  Nails Thick disfigured discolored nails with subungual debris  from hallux to fifth toes bilaterally. No evidence of bacterial infection or drainage bilaterally.  Orthopedic  No limitations of motion  feet .  No crepitus or effusions noted.  No bony pathology or digital deformities noted.  Skin  normotropic skin with no porokeratosis noted bilaterally.  No signs of infections or ulcers noted.     Onychomycosis  Pain in right toes  Pain in left toes  Consent was obtained for treatment procedures.   Mechanical debridement of nails 1-5  bilaterally performed with a nail nipper.  Filed with dremel without incident.    Return office visit  12 weeks                    Told patient to return for periodic foot care and evaluation due to potential at risk complications.   Rafi Kenneth DPM  

## 2021-11-19 ENCOUNTER — Other Ambulatory Visit: Payer: Self-pay | Admitting: *Deleted

## 2021-11-19 NOTE — Patient Outreach (Signed)
[facility]) Care Management  11/19/2021  HARWOOD NALL [birth year] [id number]  [facility]) Care Management RN Health Coach Note   11/19/2021 Name:  Alex Morton MRN:  [id number] DOB:  Oct 12, 1950  Summary: Patient states he is doing well. He reports that his diabetes and hypertension continue to be controlled. Patient explains that he is eating healthier and refills his water bottle about every one hour. His goal is to lose weight and increase his physical activity. Patient states he plans to join the new join in town and nurse provided patient with dial a dietician resource to assist him with a weight loss, diabetic, low sodium diet. Patient did not have any further questions or concerns today and did confirm that he has this nurse's contact number to call her if needed.   Recommendations/Changes made from today's visit: Continue to limit sodium, sugar, and carbohydrates in your diet Schedule an eye appointment; call [employer] to discuss benefit and who is in network Schedule a dental appointment; Call [facility] to discuss benefit and who is in network Dial a Dietitian: Call [employer] at [phone] and enter code [phone] when prompted  Subjective: Alex Morton is an 71 y.o. year old male who is a primary patient of [employer], MD. The care management team was consulted for assistance with care management and/or care coordination needs.    RN Health Coach completed Telephone Visit today.   Objective:  Medications Reviewed Today     Reviewed by Michiel Cowboy, RN (Registered Nurse) on 11/19/21 at 1158  Med List Status: <None>   Medication Order Taking? Sig Documenting Provider Last Dose Status Informant  ALPRAZolam (XANAX XR) 0.5 MG 24 hr tablet [id number] Yes Take 0.5 mg by mouth daily. [provider] Taking Active   amLODipine (NORVASC) 10 MG tablet [id number] Yes Take 10 mg by mouth daily. [provider] Taking  Active   atorvastatin (LIPITOR) 10 MG tablet [id number] Yes Take 10 mg by mouth daily. [provider] Taking Active   azithromycin (ZITHROMAX) 250 MG tablet [id number] No TAKE 2 TABLETS BY MOUTH TODAY, THEN TAKE 1 TABLET DAILY FOR 4 DAYS  Patient not taking: Reported on 02/04/2021   [provider] Not Taking Active            Med Note Laretta Alstrom, Jodie Cavey A   Wed Feb 04, 2021  1:19 PM) completed  Blood Glucose Monitoring Suppl (ONETOUCH VERIO FLEX SYSTEM) w/Device KIT [id number] Yes See admin instructions. [provider] Taking Active   chlorthalidone (HYGROTON) 25 MG tablet [id number] Yes Take 25 mg by mouth daily. [provider] Taking Active   [employer] COENZYME Q-10 100 MG capsule [id number] No Take 100 mg by mouth daily.  Patient not taking: Reported on 10/24/2020   [provider] Not Taking Active            Med Note Laretta Alstrom, Emmons Toth A   Fri Oct 24, 2020  1:42 PM) completed  doxycycline (VIBRAMYCIN) 100 MG capsule [id number] No Take 100 mg by mouth 2 (two) times daily.  Patient not taking: Reported on 10/24/2020   [provider] Not Taking Active            Med Note Laretta Alstrom, Eli Adami A   Fri Oct 24, 2020  1:42 PM) completed  ezetimibe (ZETIA) 10 MG tablet [id number] Yes Take 10 mg by mouth daily. [provider] Taking Active   FARXIGA 5 MG TABS tablet [id number] Yes SMARTSIG:5 Milligram(s) By  Mouth Every Morning [provider] Taking Active   fluconazole (DIFLUCAN) 150 MG tablet [id number] No Take 150 mg by mouth daily.  Patient not taking: Reported on 10/24/2020   [provider] Not Taking Active            Med Note Laretta Alstrom, Slayde Brault A   Fri Oct 24, 2020  1:42 PM) completed  gabapentin (NEURONTIN) 600 MG tablet [id number] Yes Take 600 mg by mouth 3 (three) times daily. Two tabs at bedtime [provider] Taking Active   glimepiride (AMARYL) 2 MG tablet [id number] Yes  [provider] Taking Active   Lancets (ONETOUCH DELICA PLUS  [id number]) [place] [id number] Yes USE TO TEST ONCE DAILY [provider] Taking Active   lisinopril (ZESTRIL) 20 MG tablet [id number]  Take by mouth. [provider]  Expired 03/05/21 2359   metoprolol (LOPRESSOR) 100 MG tablet [id number] Yes Take 100 mg by mouth 2 (two) times daily. [provider] Taking Active   MODERNA COVID-19 VACCINE 100 MCG/0.5ML injection [id number] No   Patient not taking: Reported on 02/04/2021   [provider] Not Taking Active            Med Note Laretta Alstrom, Carzell Saldivar A   Wed Feb 04, 2021  1:20 PM) completed  [facility] VERIO test strip [id number] Yes USE TO TEST BLOOD SUGAR DAILY [provider] Taking Active   pioglitazone (ACTOS) 30 MG tablet [id number] Yes Take 30 mg by mouth daily.  [provider] Taking Active   pravastatin (PRAVACHOL) 10 MG tablet [id number] No Take 10 mg by mouth daily.  Patient not taking: Reported on 10/24/2020   [provider] Not Taking Active            Med Note Laretta Alstrom, Dana Debo A   Wed May 06, 2021  4:08 PM) Stopped due to cramping             SDOH:  (Social Determinants of Health) assessments and interventions performed: SDOH assessments completed today and documented in the Epic system.  Care Plan  Review of patient past medical history, allergies, medications, health status, including review of consultants reports, laboratory and other test data, was performed as part of comprehensive evaluation for care management services.   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  RNCM Clinical Goal(s): On Track Patient will verbalize basic understanding of HTN and DMII disease process and self health management plan as evidenced by verbalizing knowledge that taking his medications as prescribed, adhering to  a low sodium and diabetic diet, and monitoring his blood sugar daily and B/P  3 times weekly and recording the values is an important part of treating his diabetes and hypertension  take all medications exactly as prescribed and will call provider for medication related questions as evidenced by patient verbalizing being compliant with taking his medications as prescribed is an important part of treating his diabetes and hypertension.     demonstrate ongoing health management independence as evidenced by continuation of monitoring his blood sugar daily and  B/P 3 times weekly and recording values, taking his medications as prescribed, and adhering to a low sodium and diabetic diet        continue to work with Consulting civil engineer and/or Social Worker to address care management and care coordination needs related to HTN and DMII as evidenced by adherence to CM Team Scheduled appointments     through collaboration with Consulting civil engineer, provider, and care team.   Diabetes Interventions:  (Status:  Goal on track:  Yes.) Long Term Goal Assessed patient's understanding of A1c goal: <7% A1c currently 7 Provided education to patient about basic DM disease process Reviewed medications with patient and discussed importance of medication adherence Counseled on importance of regular laboratory monitoring as prescribed Discussed plans with patient for ongoing care management follow up and provided patient with direct contact information for care management team Advised patient, providing education and rationale, to check cbg daily and record, calling PCP for findings outside established parameters Review of patient status, including review of consultants reports, relevant laboratory  and other test results, and medications completed Screening for signs and symptoms of depression related to chronic disease state  Assessed social determinant of health barriers No results found for: HGBA1C A1c is 7  Hypertension Interventions:  (Status:  Goal on track:  Yes.) Long Term Goal Last practice recorded BP readings:  BP Readings from Last 3 Encounters:  08/29/18 (!) 161/93  07/11/17 (!) 151/100  05/30/17 (!) 149/84  Most recent eGFR/CrCl: No results found for: EGFR  No components found for: CRCL  Evaluation of current treatment plan related to hypertension self management and patient's adherence to plan as established by provider Reviewed medications with patient and discussed importance of compliance Provided education on prescribed diet low sodium Discussed complications of poorly controlled blood pressure such as heart disease, stroke, circulatory complications, vision complications, kidney impairment, sexual dysfunction Screening for signs and symptoms of depression related to chronic disease state  Assessed social determinant of health barriers  Interventions: Inter-disciplinary care team collaboration (see longitudinal plan of care) Evaluation of current treatment plan related to  self management and patient's adherence to plan as established by provider Nurse provided education regarding hypertension and diabetes and education has previously been sent to patient Nurse discussed the importance of annual eye exams and provided education regarding retinopathy and vision loss Encouraged patient to make a dental appointment Nurse discussed importance of maintaining a low sodium, low sugar, and low carbohydrate diet Nurse encouraged continuation of taking blood sugar and B/P daily and recording the values Nurse previously sent patient education regarding calorie diet Apps MyFitnessPal and MyPlate Nurse encouraged patient to increase his physical activity to help control his  hypertension and diabetes and to maintain his strength and mobility   Patient Goals/Self-Care Activities: Take medications as prescribed   Attend all scheduled provider appointments schedule appointment with eye doctor check blood sugar at prescribed times: once daily check feet daily for cuts, sores or redness take the blood sugar meter to all doctor visits set goal weight drink 6 to 8 glasses of water each day fill half of plate with vegetables limit fast food meals to no more than 1 per week manage portion size prepare main meal at home 3 to 5 days each week read food labels for fat, fiber, carbohydrates and portion size reduce red meat to 2 to 3 times a week set a realistic goal wear comfortable, well-fitting shoes check blood pressure daily keep a blood pressure log take blood  pressure log to all doctor appointments eat more whole grains, fruits and vegetables, lean meats and healthy fats Continue to limit sodium, sugar, and carbohydrates in your diet Schedule an eye appointment; call [employer] to discuss benefit and who is in network Schedule a dental appointment; Call [facility] to discuss benefit and who is in network Dial a Dietitian: Call [employer] at [phone] and enter code [phone] when prompted       Plan: Telephone follow up appointment with care management team member scheduled for:  August.   Nurse will send PCP today's assessment note.   Emelia Loron RN, [place] [phone] [email or web address]

## 2021-11-19 NOTE — Patient Instructions (Signed)
Visit Information  Thank you for taking time to visit with me today. Please don't hesitate to contact me if I can be of assistance to you before our next scheduled telephone appointment.  Following are the goals we discussed today:  Take medications as prescribed   Attend all scheduled provider appointments schedule appointment with eye doctor check blood sugar at prescribed times: once daily check feet daily for cuts, sores or redness take the blood sugar meter to all doctor visits set goal weight drink 6 to 8 glasses of water each day fill half of plate with vegetables limit fast food meals to no more than 1 per week manage portion size prepare main meal at home 3 to 5 days each week read food labels for fat, fiber, carbohydrates and portion size reduce red meat to 2 to 3 times a week set a realistic goal wear comfortable, well-fitting shoes check blood pressure daily keep a blood pressure log take blood pressure log to all doctor appointments eat more whole grains, fruits and vegetables, lean meats and healthy fats Continue to limit sodium, sugar, and carbohydrates in your diet Schedule an eye appointment; call [employer] to discuss benefit and who is in network Schedule a dental appointment; Call [facility] to discuss benefit and who is in network Dial a Dietitian: Call [employer] at [phone] and enter code [phone] when prompted  The patient verbalized understanding of instructions, educational materials, and care plan provided today and agreed to receive a mailed copy of patient instructions, educational materials, and care plan.   Telephone follow up appointment with care management team member scheduled for: August  Emelia Loron RN, [place] [phone] [email or web address]

## 2021-11-25 DIAGNOSIS — E1122 Type 2 diabetes mellitus with diabetic chronic kidney disease: Secondary | ICD-10-CM | POA: Diagnosis not present

## 2021-11-25 DIAGNOSIS — E538 Deficiency of other specified B group vitamins: Secondary | ICD-10-CM | POA: Diagnosis not present

## 2021-11-25 DIAGNOSIS — I129 Hypertensive chronic kidney disease with stage 1 through stage 4 chronic kidney disease, or unspecified chronic kidney disease: Secondary | ICD-10-CM | POA: Diagnosis not present

## 2021-11-25 DIAGNOSIS — N183 Chronic kidney disease, stage 3 unspecified: Secondary | ICD-10-CM | POA: Diagnosis not present

## 2021-11-30 DIAGNOSIS — E1129 Type 2 diabetes mellitus with other diabetic kidney complication: Secondary | ICD-10-CM | POA: Diagnosis not present

## 2021-11-30 DIAGNOSIS — R809 Proteinuria, unspecified: Secondary | ICD-10-CM | POA: Diagnosis not present

## 2021-11-30 DIAGNOSIS — N189 Chronic kidney disease, unspecified: Secondary | ICD-10-CM | POA: Diagnosis not present

## 2021-11-30 DIAGNOSIS — I129 Hypertensive chronic kidney disease with stage 1 through stage 4 chronic kidney disease, or unspecified chronic kidney disease: Secondary | ICD-10-CM | POA: Diagnosis not present

## 2021-11-30 DIAGNOSIS — E1122 Type 2 diabetes mellitus with diabetic chronic kidney disease: Secondary | ICD-10-CM | POA: Diagnosis not present

## 2021-12-02 DIAGNOSIS — N189 Chronic kidney disease, unspecified: Secondary | ICD-10-CM | POA: Diagnosis not present

## 2021-12-02 DIAGNOSIS — E1129 Type 2 diabetes mellitus with other diabetic kidney complication: Secondary | ICD-10-CM | POA: Diagnosis not present

## 2021-12-02 DIAGNOSIS — R809 Proteinuria, unspecified: Secondary | ICD-10-CM | POA: Diagnosis not present

## 2021-12-02 DIAGNOSIS — E1122 Type 2 diabetes mellitus with diabetic chronic kidney disease: Secondary | ICD-10-CM | POA: Diagnosis not present

## 2021-12-02 DIAGNOSIS — I129 Hypertensive chronic kidney disease with stage 1 through stage 4 chronic kidney disease, or unspecified chronic kidney disease: Secondary | ICD-10-CM | POA: Diagnosis not present

## 2021-12-09 DIAGNOSIS — I10 Essential (primary) hypertension: Secondary | ICD-10-CM | POA: Diagnosis not present

## 2021-12-09 DIAGNOSIS — Z0001 Encounter for general adult medical examination with abnormal findings: Secondary | ICD-10-CM | POA: Diagnosis not present

## 2021-12-09 DIAGNOSIS — E559 Vitamin D deficiency, unspecified: Secondary | ICD-10-CM | POA: Diagnosis not present

## 2021-12-09 DIAGNOSIS — E1122 Type 2 diabetes mellitus with diabetic chronic kidney disease: Secondary | ICD-10-CM | POA: Diagnosis not present

## 2021-12-09 DIAGNOSIS — N183 Chronic kidney disease, stage 3 unspecified: Secondary | ICD-10-CM | POA: Diagnosis not present

## 2021-12-09 DIAGNOSIS — I129 Hypertensive chronic kidney disease with stage 1 through stage 4 chronic kidney disease, or unspecified chronic kidney disease: Secondary | ICD-10-CM | POA: Diagnosis not present

## 2021-12-09 DIAGNOSIS — E1129 Type 2 diabetes mellitus with other diabetic kidney complication: Secondary | ICD-10-CM | POA: Diagnosis not present

## 2022-01-01 ENCOUNTER — Ambulatory Visit: Payer: Medicare Other | Admitting: Podiatry

## 2022-01-01 ENCOUNTER — Encounter: Payer: Self-pay | Admitting: Podiatry

## 2022-01-01 DIAGNOSIS — M79674 Pain in right toe(s): Secondary | ICD-10-CM

## 2022-01-01 DIAGNOSIS — B351 Tinea unguium: Secondary | ICD-10-CM | POA: Diagnosis not present

## 2022-01-01 DIAGNOSIS — E119 Type 2 diabetes mellitus without complications: Secondary | ICD-10-CM

## 2022-01-01 DIAGNOSIS — M79675 Pain in left toe(s): Secondary | ICD-10-CM | POA: Diagnosis not present

## 2022-01-01 NOTE — Progress Notes (Signed)
This patient returns to my office for at risk foot care.  This patient requires this care by a professional since this patient will be at risk due to having diabetes.  This patient is unable to cut nails himself since the patient cannot reach his nails.These nails are painful walking and wearing shoes.  This patient presents for at risk foot care today.  General Appearance  Alert, conversant and in no acute stress.  Vascular  Dorsalis pedis and posterior tibial  pulses are palpable  bilaterally.  Capillary return is within normal limits  bilaterally. Temperature is within normal limits  bilaterally.  Neurologic  Senn-Weinstein monofilament wire test within normal limits  bilaterally. Muscle power within normal limits bilaterally.  Nails Thick disfigured discolored nails with subungual debris  from hallux to fifth toes bilaterally. No evidence of bacterial infection or drainage bilaterally.  Orthopedic  No limitations of motion  feet .  No crepitus or effusions noted.  No bony pathology or digital deformities noted.  Skin  normotropic skin with no porokeratosis noted bilaterally.  No signs of infections or ulcers noted.     Onychomycosis  Pain in right toes  Pain in left toes  Consent was obtained for treatment procedures.   Mechanical debridement of nails 1-5  bilaterally performed with a nail nipper.  Filed with dremel without incident.    Return office visit  12 weeks                    Told patient to return for periodic foot care and evaluation due to potential at risk complications.   Gregory Mayer DPM  

## 2022-01-18 ENCOUNTER — Other Ambulatory Visit: Payer: Self-pay | Admitting: *Deleted

## 2022-01-18 NOTE — Patient Outreach (Signed)
[facility]) Care Management  01/18/2022  Alex Morton 09-22-1950 [id number]  Patient has successfully completed his health and wellness goals.   Plan: RN Health Coach will close case.   Emelia Loron RN, BSN [place] [phone] [email or web address]

## 2022-02-01 DIAGNOSIS — E538 Deficiency of other specified B group vitamins: Secondary | ICD-10-CM | POA: Diagnosis not present

## 2022-02-15 ENCOUNTER — Ambulatory Visit: Payer: Medicare Other | Admitting: *Deleted

## 2022-02-25 DIAGNOSIS — N183 Chronic kidney disease, stage 3 unspecified: Secondary | ICD-10-CM | POA: Diagnosis not present

## 2022-02-25 DIAGNOSIS — I129 Hypertensive chronic kidney disease with stage 1 through stage 4 chronic kidney disease, or unspecified chronic kidney disease: Secondary | ICD-10-CM | POA: Diagnosis not present

## 2022-02-25 DIAGNOSIS — E1122 Type 2 diabetes mellitus with diabetic chronic kidney disease: Secondary | ICD-10-CM | POA: Diagnosis not present

## 2022-03-04 DIAGNOSIS — R809 Proteinuria, unspecified: Secondary | ICD-10-CM | POA: Diagnosis not present

## 2022-03-04 DIAGNOSIS — E1129 Type 2 diabetes mellitus with other diabetic kidney complication: Secondary | ICD-10-CM | POA: Diagnosis not present

## 2022-03-04 DIAGNOSIS — E1122 Type 2 diabetes mellitus with diabetic chronic kidney disease: Secondary | ICD-10-CM | POA: Diagnosis not present

## 2022-03-04 DIAGNOSIS — I129 Hypertensive chronic kidney disease with stage 1 through stage 4 chronic kidney disease, or unspecified chronic kidney disease: Secondary | ICD-10-CM | POA: Diagnosis not present

## 2022-03-04 DIAGNOSIS — N189 Chronic kidney disease, unspecified: Secondary | ICD-10-CM | POA: Diagnosis not present

## 2022-03-06 DIAGNOSIS — N189 Chronic kidney disease, unspecified: Secondary | ICD-10-CM | POA: Diagnosis not present

## 2022-03-06 DIAGNOSIS — E870 Hyperosmolality and hypernatremia: Secondary | ICD-10-CM | POA: Diagnosis not present

## 2022-03-06 DIAGNOSIS — R809 Proteinuria, unspecified: Secondary | ICD-10-CM | POA: Diagnosis not present

## 2022-03-06 DIAGNOSIS — E1129 Type 2 diabetes mellitus with other diabetic kidney complication: Secondary | ICD-10-CM | POA: Diagnosis not present

## 2022-03-06 DIAGNOSIS — I129 Hypertensive chronic kidney disease with stage 1 through stage 4 chronic kidney disease, or unspecified chronic kidney disease: Secondary | ICD-10-CM | POA: Diagnosis not present

## 2022-03-06 DIAGNOSIS — E1122 Type 2 diabetes mellitus with diabetic chronic kidney disease: Secondary | ICD-10-CM | POA: Diagnosis not present

## 2022-03-31 DIAGNOSIS — E538 Deficiency of other specified B group vitamins: Secondary | ICD-10-CM | POA: Diagnosis not present

## 2022-03-31 DIAGNOSIS — Z23 Encounter for immunization: Secondary | ICD-10-CM | POA: Diagnosis not present

## 2022-04-14 ENCOUNTER — Ambulatory Visit: Payer: Medicare Other | Admitting: Podiatry

## 2022-04-14 ENCOUNTER — Encounter: Payer: Self-pay | Admitting: Podiatry

## 2022-04-14 DIAGNOSIS — M79675 Pain in left toe(s): Secondary | ICD-10-CM

## 2022-04-14 DIAGNOSIS — B351 Tinea unguium: Secondary | ICD-10-CM

## 2022-04-14 DIAGNOSIS — M79674 Pain in right toe(s): Secondary | ICD-10-CM

## 2022-04-14 DIAGNOSIS — E119 Type 2 diabetes mellitus without complications: Secondary | ICD-10-CM | POA: Diagnosis not present

## 2022-04-14 NOTE — Progress Notes (Signed)
This patient returns to my office for at risk foot care.  This patient requires this care by a professional since this patient will be at risk due to having diabetes.  This patient is unable to cut nails himself since the patient cannot reach his nails.These nails are painful walking and wearing shoes.  This patient presents for at risk foot care today.  General Appearance  Alert, conversant and in no acute stress.  Vascular  Dorsalis pedis and posterior tibial  pulses are palpable  bilaterally.  Capillary return is within normal limits  bilaterally. Temperature is within normal limits  bilaterally.  Neurologic  Senn-Weinstein monofilament wire test within normal limits  bilaterally. Muscle power within normal limits bilaterally.  Nails Thick disfigured discolored nails with subungual debris  from hallux to fifth toes bilaterally. No evidence of bacterial infection or drainage bilaterally.  Orthopedic  No limitations of motion  feet .  No crepitus or effusions noted.  No bony pathology or digital deformities noted.  Skin  normotropic skin with no porokeratosis noted bilaterally.  No signs of infections or ulcers noted.     Onychomycosis  Pain in right toes  Pain in left toes  Consent was obtained for treatment procedures.   Mechanical debridement of nails 1-5  bilaterally performed with a nail nipper.  Filed with dremel without incident.    Return office visit  12 weeks                    Told patient to return for periodic foot care and evaluation due to potential at risk complications.   Pat Sires DPM  

## 2022-04-22 DIAGNOSIS — E1122 Type 2 diabetes mellitus with diabetic chronic kidney disease: Secondary | ICD-10-CM | POA: Diagnosis not present

## 2022-04-22 DIAGNOSIS — J209 Acute bronchitis, unspecified: Secondary | ICD-10-CM | POA: Diagnosis not present

## 2022-04-27 DIAGNOSIS — N183 Chronic kidney disease, stage 3 unspecified: Secondary | ICD-10-CM | POA: Diagnosis not present

## 2022-04-27 DIAGNOSIS — E1122 Type 2 diabetes mellitus with diabetic chronic kidney disease: Secondary | ICD-10-CM | POA: Diagnosis not present

## 2022-04-27 DIAGNOSIS — I129 Hypertensive chronic kidney disease with stage 1 through stage 4 chronic kidney disease, or unspecified chronic kidney disease: Secondary | ICD-10-CM | POA: Diagnosis not present

## 2022-05-07 DIAGNOSIS — N189 Chronic kidney disease, unspecified: Secondary | ICD-10-CM | POA: Diagnosis not present

## 2022-05-07 DIAGNOSIS — I129 Hypertensive chronic kidney disease with stage 1 through stage 4 chronic kidney disease, or unspecified chronic kidney disease: Secondary | ICD-10-CM | POA: Diagnosis not present

## 2022-05-07 DIAGNOSIS — E1122 Type 2 diabetes mellitus with diabetic chronic kidney disease: Secondary | ICD-10-CM | POA: Diagnosis not present

## 2022-05-07 DIAGNOSIS — E1129 Type 2 diabetes mellitus with other diabetic kidney complication: Secondary | ICD-10-CM | POA: Diagnosis not present

## 2022-05-07 DIAGNOSIS — R809 Proteinuria, unspecified: Secondary | ICD-10-CM | POA: Diagnosis not present

## 2022-05-29 DIAGNOSIS — N189 Chronic kidney disease, unspecified: Secondary | ICD-10-CM | POA: Diagnosis not present

## 2022-05-29 DIAGNOSIS — I9589 Other hypotension: Secondary | ICD-10-CM | POA: Diagnosis not present

## 2022-05-29 DIAGNOSIS — R809 Proteinuria, unspecified: Secondary | ICD-10-CM | POA: Diagnosis not present

## 2022-05-29 DIAGNOSIS — E1129 Type 2 diabetes mellitus with other diabetic kidney complication: Secondary | ICD-10-CM | POA: Diagnosis not present

## 2022-05-29 DIAGNOSIS — Z8679 Personal history of other diseases of the circulatory system: Secondary | ICD-10-CM | POA: Diagnosis not present

## 2022-05-29 DIAGNOSIS — E1122 Type 2 diabetes mellitus with diabetic chronic kidney disease: Secondary | ICD-10-CM | POA: Diagnosis not present

## 2022-06-25 DIAGNOSIS — E538 Deficiency of other specified B group vitamins: Secondary | ICD-10-CM | POA: Diagnosis not present

## 2022-07-21 ENCOUNTER — Encounter: Payer: Self-pay | Admitting: Podiatry

## 2022-07-21 ENCOUNTER — Ambulatory Visit (INDEPENDENT_AMBULATORY_CARE_PROVIDER_SITE_OTHER): Payer: Medicare Other | Admitting: Podiatry

## 2022-07-21 DIAGNOSIS — M79675 Pain in left toe(s): Secondary | ICD-10-CM | POA: Diagnosis not present

## 2022-07-21 DIAGNOSIS — M79674 Pain in right toe(s): Secondary | ICD-10-CM | POA: Diagnosis not present

## 2022-07-21 DIAGNOSIS — B351 Tinea unguium: Secondary | ICD-10-CM | POA: Diagnosis not present

## 2022-07-21 DIAGNOSIS — E119 Type 2 diabetes mellitus without complications: Secondary | ICD-10-CM | POA: Diagnosis not present

## 2022-07-21 NOTE — Progress Notes (Signed)
This patient returns to my office for at risk foot care.  This patient requires this care by a professional since this patient will be at risk due to having diabetes.  This patient is unable to cut nails himself since the patient cannot reach his nails.These nails are painful walking and wearing shoes.  This patient presents for at risk foot care today.  General Appearance  Alert, conversant and in no acute stress.  Vascular  Dorsalis pedis and posterior tibial  pulses are palpable  bilaterally.  Capillary return is within normal limits  bilaterally. Temperature is within normal limits  bilaterally.  Neurologic  Senn-Weinstein monofilament wire test within normal limits  bilaterally. Muscle power within normal limits bilaterally.  Nails Thick disfigured discolored nails with subungual debris  from hallux to fifth toes bilaterally. No evidence of bacterial infection or drainage bilaterally.  Orthopedic  No limitations of motion  feet .  No crepitus or effusions noted.  No bony pathology or digital deformities noted.  Skin  normotropic skin with no porokeratosis noted bilaterally.  No signs of infections or ulcers noted.     Onychomycosis  Pain in right toes  Pain in left toes  Consent was obtained for treatment procedures.   Mechanical debridement of nails 1-5  bilaterally performed with a nail nipper.  Filed with dremel without incident.    Return office visit  12 weeks                    Told patient to return for periodic foot care and evaluation due to potential at risk complications.   Gardiner Barefoot DPM

## 2022-07-28 DIAGNOSIS — E1122 Type 2 diabetes mellitus with diabetic chronic kidney disease: Secondary | ICD-10-CM | POA: Diagnosis not present

## 2022-07-28 DIAGNOSIS — N183 Chronic kidney disease, stage 3 unspecified: Secondary | ICD-10-CM | POA: Diagnosis not present

## 2022-07-28 DIAGNOSIS — I129 Hypertensive chronic kidney disease with stage 1 through stage 4 chronic kidney disease, or unspecified chronic kidney disease: Secondary | ICD-10-CM | POA: Diagnosis not present

## 2022-08-17 DIAGNOSIS — E538 Deficiency of other specified B group vitamins: Secondary | ICD-10-CM | POA: Diagnosis not present

## 2022-08-26 DIAGNOSIS — I129 Hypertensive chronic kidney disease with stage 1 through stage 4 chronic kidney disease, or unspecified chronic kidney disease: Secondary | ICD-10-CM | POA: Diagnosis not present

## 2022-08-26 DIAGNOSIS — N183 Chronic kidney disease, stage 3 unspecified: Secondary | ICD-10-CM | POA: Diagnosis not present

## 2022-08-26 DIAGNOSIS — E1122 Type 2 diabetes mellitus with diabetic chronic kidney disease: Secondary | ICD-10-CM | POA: Diagnosis not present

## 2022-08-27 DIAGNOSIS — E1129 Type 2 diabetes mellitus with other diabetic kidney complication: Secondary | ICD-10-CM | POA: Diagnosis not present

## 2022-08-27 DIAGNOSIS — E1122 Type 2 diabetes mellitus with diabetic chronic kidney disease: Secondary | ICD-10-CM | POA: Diagnosis not present

## 2022-08-27 DIAGNOSIS — N189 Chronic kidney disease, unspecified: Secondary | ICD-10-CM | POA: Diagnosis not present

## 2022-08-27 DIAGNOSIS — R809 Proteinuria, unspecified: Secondary | ICD-10-CM | POA: Diagnosis not present

## 2022-08-30 DIAGNOSIS — D638 Anemia in other chronic diseases classified elsewhere: Secondary | ICD-10-CM | POA: Diagnosis not present

## 2022-08-30 DIAGNOSIS — E1129 Type 2 diabetes mellitus with other diabetic kidney complication: Secondary | ICD-10-CM | POA: Diagnosis not present

## 2022-08-30 DIAGNOSIS — I129 Hypertensive chronic kidney disease with stage 1 through stage 4 chronic kidney disease, or unspecified chronic kidney disease: Secondary | ICD-10-CM | POA: Diagnosis not present

## 2022-08-30 DIAGNOSIS — N189 Chronic kidney disease, unspecified: Secondary | ICD-10-CM | POA: Diagnosis not present

## 2022-08-30 DIAGNOSIS — E1122 Type 2 diabetes mellitus with diabetic chronic kidney disease: Secondary | ICD-10-CM | POA: Diagnosis not present

## 2022-08-30 DIAGNOSIS — R809 Proteinuria, unspecified: Secondary | ICD-10-CM | POA: Diagnosis not present

## 2022-09-26 DIAGNOSIS — E1122 Type 2 diabetes mellitus with diabetic chronic kidney disease: Secondary | ICD-10-CM | POA: Diagnosis not present

## 2022-09-26 DIAGNOSIS — N183 Chronic kidney disease, stage 3 unspecified: Secondary | ICD-10-CM | POA: Diagnosis not present

## 2022-09-26 DIAGNOSIS — I129 Hypertensive chronic kidney disease with stage 1 through stage 4 chronic kidney disease, or unspecified chronic kidney disease: Secondary | ICD-10-CM | POA: Diagnosis not present

## 2022-10-11 ENCOUNTER — Encounter: Payer: Self-pay | Admitting: Podiatry

## 2022-10-22 ENCOUNTER — Ambulatory Visit: Payer: Medicare Other | Admitting: Podiatry

## 2022-11-24 DIAGNOSIS — E538 Deficiency of other specified B group vitamins: Secondary | ICD-10-CM | POA: Diagnosis not present

## 2022-12-03 DIAGNOSIS — R809 Proteinuria, unspecified: Secondary | ICD-10-CM | POA: Diagnosis not present

## 2022-12-03 DIAGNOSIS — Z79899 Other long term (current) drug therapy: Secondary | ICD-10-CM | POA: Diagnosis not present

## 2022-12-03 DIAGNOSIS — I129 Hypertensive chronic kidney disease with stage 1 through stage 4 chronic kidney disease, or unspecified chronic kidney disease: Secondary | ICD-10-CM | POA: Diagnosis not present

## 2022-12-03 DIAGNOSIS — E1122 Type 2 diabetes mellitus with diabetic chronic kidney disease: Secondary | ICD-10-CM | POA: Diagnosis not present

## 2022-12-03 DIAGNOSIS — N189 Chronic kidney disease, unspecified: Secondary | ICD-10-CM | POA: Diagnosis not present

## 2022-12-08 DIAGNOSIS — Z7689 Persons encountering health services in other specified circumstances: Secondary | ICD-10-CM | POA: Diagnosis not present

## 2022-12-08 DIAGNOSIS — I129 Hypertensive chronic kidney disease with stage 1 through stage 4 chronic kidney disease, or unspecified chronic kidney disease: Secondary | ICD-10-CM | POA: Diagnosis not present

## 2022-12-08 DIAGNOSIS — E1122 Type 2 diabetes mellitus with diabetic chronic kidney disease: Secondary | ICD-10-CM | POA: Diagnosis not present

## 2022-12-21 DIAGNOSIS — E1122 Type 2 diabetes mellitus with diabetic chronic kidney disease: Secondary | ICD-10-CM | POA: Diagnosis not present

## 2022-12-21 DIAGNOSIS — E1165 Type 2 diabetes mellitus with hyperglycemia: Secondary | ICD-10-CM | POA: Diagnosis not present

## 2022-12-21 DIAGNOSIS — N183 Chronic kidney disease, stage 3 unspecified: Secondary | ICD-10-CM | POA: Diagnosis not present

## 2022-12-21 DIAGNOSIS — E7849 Other hyperlipidemia: Secondary | ICD-10-CM | POA: Diagnosis not present

## 2022-12-21 DIAGNOSIS — E559 Vitamin D deficiency, unspecified: Secondary | ICD-10-CM | POA: Diagnosis not present

## 2022-12-21 DIAGNOSIS — I129 Hypertensive chronic kidney disease with stage 1 through stage 4 chronic kidney disease, or unspecified chronic kidney disease: Secondary | ICD-10-CM | POA: Diagnosis not present

## 2022-12-21 DIAGNOSIS — Z0001 Encounter for general adult medical examination with abnormal findings: Secondary | ICD-10-CM | POA: Diagnosis not present

## 2022-12-21 DIAGNOSIS — E538 Deficiency of other specified B group vitamins: Secondary | ICD-10-CM | POA: Diagnosis not present

## 2022-12-21 DIAGNOSIS — I10 Essential (primary) hypertension: Secondary | ICD-10-CM | POA: Diagnosis not present

## 2022-12-21 DIAGNOSIS — G720 Drug-induced myopathy: Secondary | ICD-10-CM | POA: Diagnosis not present

## 2022-12-21 DIAGNOSIS — E1140 Type 2 diabetes mellitus with diabetic neuropathy, unspecified: Secondary | ICD-10-CM | POA: Diagnosis not present

## 2022-12-29 ENCOUNTER — Ambulatory Visit (INDEPENDENT_AMBULATORY_CARE_PROVIDER_SITE_OTHER): Payer: Medicare Other | Admitting: Podiatry

## 2022-12-29 ENCOUNTER — Encounter: Payer: Self-pay | Admitting: Podiatry

## 2022-12-29 DIAGNOSIS — M79674 Pain in right toe(s): Secondary | ICD-10-CM

## 2022-12-29 DIAGNOSIS — M79675 Pain in left toe(s): Secondary | ICD-10-CM | POA: Diagnosis not present

## 2022-12-29 DIAGNOSIS — B351 Tinea unguium: Secondary | ICD-10-CM | POA: Diagnosis not present

## 2022-12-29 DIAGNOSIS — E119 Type 2 diabetes mellitus without complications: Secondary | ICD-10-CM | POA: Diagnosis not present

## 2022-12-29 NOTE — Progress Notes (Signed)
This patient returns to my office for at risk foot care.  This patient requires this care by a professional since this patient will be at risk due to having diabetes.  This patient is unable to cut nails himself since the patient cannot reach his nails.These nails are painful walking and wearing shoes.  This patient presents for at risk foot care today.  General Appearance  Alert, conversant and in no acute stress.  Vascular  Dorsalis pedis and posterior tibial  pulses are palpable  bilaterally.  Capillary return is within normal limits  bilaterally. Temperature is within normal limits  bilaterally.  Neurologic  Senn-Weinstein monofilament wire test within normal limits  bilaterally. Muscle power within normal limits bilaterally.  Nails Thick disfigured discolored nails with subungual debris  from hallux to fifth toes bilaterally. No evidence of bacterial infection or drainage bilaterally.  Orthopedic  No limitations of motion  feet .  No crepitus or effusions noted.  No bony pathology or digital deformities noted.  Skin  normotropic skin with no porokeratosis noted bilaterally.  No signs of infections or ulcers noted.     Onychomycosis  Pain in right toes  Pain in left toes  Consent was obtained for treatment procedures.   Mechanical debridement of nails 1-5  bilaterally performed with a nail nipper.  Filed with dremel without incident.    Return office visit  12 weeks                    Told patient to return for periodic foot care and evaluation due to potential at risk complications.   Amel Gianino DPM  

## 2023-02-09 DIAGNOSIS — E538 Deficiency of other specified B group vitamins: Secondary | ICD-10-CM | POA: Diagnosis not present

## 2023-02-09 DIAGNOSIS — Z1211 Encounter for screening for malignant neoplasm of colon: Secondary | ICD-10-CM | POA: Diagnosis not present

## 2023-02-09 DIAGNOSIS — Z1212 Encounter for screening for malignant neoplasm of rectum: Secondary | ICD-10-CM | POA: Diagnosis not present

## 2023-02-23 ENCOUNTER — Encounter: Payer: Self-pay | Admitting: Internal Medicine

## 2023-02-23 ENCOUNTER — Ambulatory Visit (INDEPENDENT_AMBULATORY_CARE_PROVIDER_SITE_OTHER): Payer: Medicare Other | Admitting: Internal Medicine

## 2023-02-23 VITALS — BP 120/72 | HR 55 | Temp 97.9°F | Ht 73.0 in | Wt 245.1 lb

## 2023-02-23 DIAGNOSIS — R195 Other fecal abnormalities: Secondary | ICD-10-CM

## 2023-02-23 NOTE — Progress Notes (Signed)
Primary Care Physician:  Elfredia Nevins, MD Primary Gastroenterologist:  Dr. Marletta Lor  Chief Complaint  Patient presents with   New Patient (Initial Visit)    Referred for pos cologuard    HPI:   Alex Morton is a 72 y.o. male who presents to the clinic today by referral from his PCP Dr. Sherwood Gambler for evaluation.  Recently had a positive Cologuard test.  Denies any gross melena or hematochezia.  No family history of colorectal malignancy.  States he had a colonoscopy many years ago, unsure of results.  I do not have access to this report.  No abdominal pain.  No unintentional weight loss.  Denies any upper GI symptoms including heartburn, reflux, dysphagia odynophagia, epigastric or chest pain.  Overall feels well from a GI standpoint.  Past Medical History:  Diagnosis Date   Diabetes mellitus without complication (HCC)    Hyperlipidemia    Hypertension     No past surgical history on file.  Current Outpatient Medications  Medication Sig Dispense Refill   amLODipine (NORVASC) 10 MG tablet Take 10 mg by mouth daily.     chlorthalidone (HYGROTON) 25 MG tablet Take 25 mg by mouth daily.     FARXIGA 5 MG TABS tablet SMARTSIG:5 Milligram(s) By Mouth Every Morning     gabapentin (NEURONTIN) 600 MG tablet Take 600 mg by mouth 3 (three) times daily. Two tabs at bedtime     glimepiride (AMARYL) 2 MG tablet      metoprolol (LOPRESSOR) 100 MG tablet Take 100 mg by mouth 2 (two) times daily.     pioglitazone (ACTOS) 30 MG tablet Take 30 mg by mouth daily.      albuterol (VENTOLIN HFA) 108 (90 Base) MCG/ACT inhaler SMARTSIG:1-2 Puff(s) Via Inhaler Every 4 Hours PRN (Patient not taking: Reported on 02/23/2023)     ALPRAZolam (XANAX XR) 0.5 MG 24 hr tablet Take 0.5 mg by mouth daily. (Patient not taking: Reported on 02/23/2023)     atorvastatin (LIPITOR) 10 MG tablet Take 10 mg by mouth daily. (Patient not taking: Reported on 02/23/2023)  11   azithromycin (ZITHROMAX) 250 MG tablet  (Patient  not taking: Reported on 02/23/2023)     Blood Glucose Monitoring Suppl (ONETOUCH VERIO FLEX SYSTEM) w/Device KIT See admin instructions. (Patient not taking: Reported on 02/23/2023)     [employer] COENZYME Q-10 100 MG capsule Take 100 mg by mouth daily. (Patient not taking: Reported on 02/23/2023)  11   doxycycline (VIBRAMYCIN) 100 MG capsule Take 100 mg by mouth 2 (two) times daily. (Patient not taking: Reported on 02/23/2023)     ezetimibe (ZETIA) 10 MG tablet Take 10 mg by mouth daily. (Patient not taking: Reported on 02/23/2023)     fluconazole (DIFLUCAN) 150 MG tablet Take 150 mg by mouth daily. (Patient not taking: Reported on 02/23/2023)     Lancets (ONETOUCH DELICA PLUS LANCET33G) MISC USE TO TEST ONCE DAILY (Patient not taking: Reported on 02/23/2023)  2   lisinopril (ZESTRIL) 20 MG tablet Take by mouth.     MODERNA COVID-19 VACCINE 100 MCG/0.5ML injection  (Patient not taking: Reported on 02/23/2023)     ONETOUCH VERIO test strip USE TO TEST BLOOD SUGAR DAILY (Patient not taking: Reported on 02/23/2023)     pravastatin (PRAVACHOL) 10 MG tablet Take 10 mg by mouth daily. (Patient not taking: Reported on 02/23/2023)     No current facility-administered medications for this visit.    Allergies as of 02/23/2023   (No Known Allergies)  No family history on file.  Social History   Socioeconomic History   Marital status: Media planner    Spouse name: Not on file   Number of children: Not on file   Years of education: Not on file   Highest education level: Not on file  Occupational History   Occupation: Retired  Tobacco Use   Smoking status: Every Day    Current packs/day: 0.50    Types: Cigarettes   Smokeless tobacco: Never   Tobacco comments:    patient explains he is not ready to quit  Vaping Use   Vaping status: Never Used  Substance and Sexual Activity   Alcohol use: No   Drug use: No   Sexual activity: Not Currently  Other Topics Concern   Not on file  Social History  Narrative   Not on file   Social Determinants of Health   Financial Resource Strain: Low Risk  (05/06/2021)   Overall Financial Resource Strain (CARDIA)    Difficulty of Paying Living Expenses: Not hard at all  Food Insecurity: No Food Insecurity (11/19/2021)   Hunger Vital Sign    Worried About Running Out of Food in the Last Year: Never true    Ran Out of Food in the Last Year: Never true  Transportation Needs: No Transportation Needs (11/19/2021)   PRAPARE - Administrator, Civil Service (Medical): No    Lack of Transportation (Non-Medical): No  Physical Activity: Not on file  Stress: Not on file  Social Connections: Not on file  Intimate Partner Violence: Not on file    Subjective: Review of Systems  Constitutional:  Negative for chills and fever.  HENT:  Negative for congestion and hearing loss.   Eyes:  Negative for blurred vision and double vision.  Respiratory:  Negative for cough and shortness of breath.   Cardiovascular:  Negative for chest pain and palpitations.  Gastrointestinal:  Negative for abdominal pain, blood in stool, constipation, diarrhea, heartburn, melena and vomiting.  Genitourinary:  Negative for dysuria and urgency.  Musculoskeletal:  Negative for joint pain and myalgias.  Skin:  Negative for itching and rash.  Neurological:  Negative for dizziness and headaches.  Psychiatric/Behavioral:  Negative for depression. The patient is not nervous/anxious.        Objective: BP 120/72   Pulse (!) 55   Temp 97.9 F (36.6 C)   Ht 6\' 1"  (1.854 m)   Wt 245 lb 1.6 oz (111.2 kg)   BMI 32.34 kg/m  Physical Exam Constitutional:      Appearance: Normal appearance.  HENT:     Head: Normocephalic and atraumatic.  Eyes:     Extraocular Movements: Extraocular movements intact.     Conjunctiva/sclera: Conjunctivae normal.  Cardiovascular:     Rate and Rhythm: Normal rate and regular rhythm.  Pulmonary:     Effort: Pulmonary effort is normal.      Breath sounds: Normal breath sounds.  Abdominal:     General: Bowel sounds are normal.     Palpations: Abdomen is soft.  Musculoskeletal:        General: Normal range of motion.     Cervical back: Normal range of motion and neck supple.  Skin:    General: Skin is warm.  Neurological:     General: No focal deficit present.     Mental Status: He is alert and oriented to person, place, and time.  Psychiatric:        Mood and Affect: Mood  normal.        Behavior: Behavior normal.      Assessment: *Positive Cologuard testing  Plan: Will schedule for screening colonoscopy.The risks including infection, bleed, or perforation as well as benefits, limitations, alternatives and imponderables have been reviewed with the patient. Questions have been answered. All parties agreeable.  Thank you Dr. Sherwood Gambler for the kind referral.   02/23/2023 9:41 AM   Disclaimer: This note was dictated with voice recognition software. Similar sounding words can inadvertently be transcribed and may not be corrected upon review.

## 2023-02-23 NOTE — Patient Instructions (Signed)
We will set you up for colonoscopy given your recent positive Cologuard testing.  It was very nice meeting you today.  Dr. Marletta Lor

## 2023-03-14 ENCOUNTER — Telehealth: Payer: Self-pay | Admitting: *Deleted

## 2023-03-14 NOTE — Telephone Encounter (Signed)
[facility]  TCS ASA 3 w/Dr.Carver, Renal friendly prep & tap water enema

## 2023-03-15 MED ORDER — PEG 3350-KCL-NA BICARB-NACL 420 G PO SOLR: 4000.0000 mL | Freq: Once | ORAL | 0 refills | Status: AC

## 2023-03-15 NOTE — Telephone Encounter (Signed)
Patient called back. He has been scheduled for 10/7. Aware needs to hold farxiga 3 days prior. Will send prep to pharmacy. Instructions also sent. Aware will call back with pre-op appt.

## 2023-03-15 NOTE — Addendum Note (Signed)
Addended by: Armstead Peaks on: 03/15/2023 04:24 PM   Modules accepted: Orders

## 2023-03-24 DIAGNOSIS — E538 Deficiency of other specified B group vitamins: Secondary | ICD-10-CM | POA: Diagnosis not present

## 2023-03-28 DIAGNOSIS — N183 Chronic kidney disease, stage 3 unspecified: Secondary | ICD-10-CM | POA: Diagnosis not present

## 2023-03-28 DIAGNOSIS — E1140 Type 2 diabetes mellitus with diabetic neuropathy, unspecified: Secondary | ICD-10-CM | POA: Diagnosis not present

## 2023-03-28 DIAGNOSIS — E1122 Type 2 diabetes mellitus with diabetic chronic kidney disease: Secondary | ICD-10-CM | POA: Diagnosis not present

## 2023-03-28 DIAGNOSIS — E782 Mixed hyperlipidemia: Secondary | ICD-10-CM | POA: Diagnosis not present

## 2023-03-28 DIAGNOSIS — I129 Hypertensive chronic kidney disease with stage 1 through stage 4 chronic kidney disease, or unspecified chronic kidney disease: Secondary | ICD-10-CM | POA: Diagnosis not present

## 2023-03-29 NOTE — Patient Instructions (Signed)
Alex Morton  03/29/2023     @PREFPERIOPPHARMACY @   Your procedure is scheduled on 04/04/2023.  Report to [name] at 10:30 A.M.  Call this number if you have problems the morning of surgery:  [phone]  If you experience any cold or flu symptoms such as cough, fever, chills, shortness of breath, etc. between now and your scheduled surgery, please notify us at the above number.   Remember:   Please follow the diet and prep instructions given to you by [name] office.    Take these medicines the morning of surgery with A SIP OF WATER : Norvasc Neurontin and Metoprolol   Do not take any diabetic medications the am of the procedure.    Last dose of [name] should be on 03/31/23 or before.    Do not wear jewelry, make-up or nail polish, including gel polish,  artificial nails, or any other type of covering on natural nails (fingers and  toes).  Do not wear lotions, powders, or perfumes, or deodorant.  Do not shave 48 hours prior to surgery.  Men may shave face and neck.  Do not bring valuables to the hospital.  [name] is not responsible for any belongings or valuables.  Contacts, dentures or bridgework may not be worn into surgery.  Leave your suitcase in the car.  After surgery it may be brought to your room.  For patients admitted to the hospital, discharge time will be determined by your treatment team.  Patients discharged the day of surgery will not be allowed to drive home.   Name and phone number of your driver:   Family Special instructions:  N/A  Please read over the following fact sheets that you were given. Care and Recovery After Surgery    Colonoscopy, Adult A colonoscopy is a procedure to look at the entire large intestine. This procedure is done using a long, thin, flexible tube that has a camera on the end. You may have a colonoscopy: As a part of normal colorectal screening. If you have certain symptoms, such as: A low number of  red blood cells in your blood (anemia). Diarrhea that does not go away. Pain in your abdomen. Blood in your stool. A colonoscopy can help screen for and diagnose medical problems, including: An abnormal growth of cells or tissue (tumor). Abnormal growths within the lining of your intestine (polyps). Inflammation. Areas of bleeding. Tell your health care provider about: Any allergies you have. All medicines you are taking, including vitamins, herbs, eye drops, creams, and over-the-counter medicines. Any problems you or family members have had with anesthetic medicines. Any bleeding problems you have. Any surgeries you have had. Any medical conditions you have. Any problems you have had with having bowel movements. Whether you are pregnant or may be pregnant. What are the risks? Generally, this is a safe procedure. However, problems may occur, including: Bleeding. Damage to your intestine. Allergic reactions to medicines given during the procedure. Infection. This is rare. What happens before the procedure? Eating and drinking restrictions Follow instructions from your health care provider about eating or drinking restrictions, which may include: A few days before the procedure: Follow a low-fiber diet. Avoid nuts, seeds, dried fruit, raw fruits, and vegetables. 1-3 days before the procedure: Eat only gelatin dessert or ice pops. Drink only clear liquids, such as water, clear juice, clear broth or bouillon, black coffee or tea, or clear soft drinks or sports drinks. Avoid liquids that contain red or purple  dye. The day of the procedure: Do not eat solid foods. You may continue to drink clear liquids until up to 2 hours before the procedure. Do not eat or drink anything starting 2 hours before the procedure, or within the time period that your health care provider recommends. Bowel prep If you were prescribed a bowel prep to take by mouth (orally) to clean out your colon: Take it  as told by your health care provider. Starting the day before your procedure, you will need to drink a large amount of liquid medicine. The liquid will cause you to have many bowel movements of loose stool until your stool becomes almost clear or light green. If your skin or the opening between the buttocks (anus) gets irritated from diarrhea, you may relieve the irritation using: Wipes with medicine in them, such as adult wet wipes with aloe and vitamin E. A product to soothe skin, such as petroleum jelly. If you vomit while drinking the bowel prep: Take a break for up to 60 minutes. Begin the bowel prep again. Call your health care provider if you keep vomiting or you cannot take the bowel prep without vomiting. To clean out your colon, you may also be given: Laxative medicines. These help you have a bowel movement. Instructions for enema use. An enema is liquid medicine injected into your rectum. Medicines Ask your health care provider about: Changing or stopping your regular medicines or supplements. This is especially important if you are taking iron supplements, diabetes medicines, or blood thinners. Taking medicines such as aspirin and ibuprofen. These medicines can thin your blood. Do not take these medicines unless your health care provider tells you to take them. Taking over-the-counter medicines, vitamins, herbs, and supplements. General instructions Ask your health care provider what steps will be taken to help prevent infection. These may include washing skin with a germ-killing soap. If you will be going home right after the procedure, plan to have a responsible adult: Take you home from the hospital or clinic. You will not be allowed to drive. Care for you for the time you are told. What happens during the procedure?  An IV will be inserted into one of your veins. You will be given a medicine to make you fall asleep (general anesthetic). You will lie on your side with your  knees bent. A lubricant will be put on the tube. Then the tube will be: Inserted into your anus. Gently eased through all parts of your large intestine. Air will be sent into your colon to keep it open. This may cause some pressure or cramping. Images will be taken with the camera and will appear on a screen. A small tissue sample may be removed to be looked at under a microscope (biopsy). The tissue may be sent to a lab for testing if any signs of problems are found. If small polyps are found, they may be removed and checked for cancer cells. When the procedure is finished, the tube will be removed. The procedure may vary among health care providers and hospitals. What happens after the procedure? Your blood pressure, heart rate, breathing rate, and blood oxygen level will be monitored until you leave the hospital or clinic. You may have a small amount of blood in your stool. You may pass gas and have mild cramping or bloating in your abdomen. This is caused by the air that was used to open your colon during the exam. If you were given a sedative during the procedure,  it can affect you for several hours. Do not drive or operate machinery until your health care provider says that it is safe. It is up to you to get the results of your procedure. Ask your health care provider, or the department that is doing the procedure, when your results will be ready. Summary A colonoscopy is a procedure to look at the entire large intestine. Follow instructions from your health care provider about eating and drinking before the procedure. If you were prescribed an oral bowel prep to clean out your colon, take it as told by your health care provider. During the colonoscopy, a flexible tube with a camera on its end is inserted into the anus and then passed into all parts of the large intestine. This information is not intended to replace advice given to you by your health care provider. Make sure you discuss  any questions you have with your health care provider. Document Revised: 07/27/2022 Document Reviewed: 02/04/2021 Elsevier Patient Education  2024 [employer]  Monitored Anesthesia Care Anesthesia refers to the techniques, procedures, and medicines that help a person stay safe and comfortable during surgery. Monitored anesthesia care, or sedation, is one type of anesthesia. You may have sedation if you do not need to be asleep for your procedure. Procedures that use sedation may include: Surgery to remove cataracts from your eyes. A dental procedure. A biopsy. This is when a tissue sample is removed and looked at under a microscope. You will be watched closely during your procedure. Your level of sedation or type of anesthesia may be changed to fit your needs. Tell a health care provider about: Any allergies you have. All medicines you are taking, including vitamins, herbs, eye drops, creams, and over-the-counter medicines. Any problems you or family members have had with anesthesia. Any bleeding problems you have. Any surgeries you have had. Any medical conditions or illnesses you have. This includes sleep apnea, cough, fever, or the flu. Whether you are pregnant or may be pregnant. Whether you use cigarettes, alcohol, or drugs. Any use of steroids, whether by mouth or as a cream. What are the risks? Your health care provider will talk with you about risks. These may include: Getting too much medicine (oversedation). Nausea. Allergic reactions to medicines. Trouble breathing. If this happens, a breathing tube may be used to help you breathe. It will be removed when you are awake and breathing on your own. Heart trouble. Lung trouble. Confusion that gets better with time (emergence delirium). What happens before the procedure? When to stop eating and drinking Follow instructions from your health care provider about what you may eat and drink. These may include: 8 hours before your  procedure Stop eating most foods. Do not eat meat, fried foods, or fatty foods. Eat only light foods, such as toast or crackers. All liquids are okay except energy drinks and alcohol. 6 hours before your procedure Stop eating. Drink only clear liquids, such as water, clear fruit juice, black coffee, plain tea, and sports drinks. Do not drink energy drinks or alcohol. 2 hours before your procedure Stop drinking all liquids. You may be allowed to take medicines with small sips of water. If you do not follow your health care provider's instructions, your procedure may be delayed or canceled. Medicines Ask your health care provider about: Changing or stopping your regular medicines. These include any diabetes medicines or blood thinners you take. Taking medicines such as aspirin and ibuprofen. These medicines can thin your blood. Do not  take them unless your health care provider tells you to. Taking over-the-counter medicines, vitamins, herbs, and supplements. Testing You may have an exam or testing. You may have a blood or urine sample taken. General instructions Do not use any products that contain nicotine or tobacco for at least 4 weeks before the procedure. These products include cigarettes, chewing tobacco, and vaping devices, such as e-cigarettes. If you need help quitting, ask your health care provider. If you will be going home right after the procedure, plan to have a responsible adult: Take you home from the hospital or clinic. You will not be allowed to drive. Care for you for the time you are told. What happens during the procedure?  Your blood pressure, heart rate, breathing, level of pain, and blood oxygen level will be monitored. An IV will be inserted into one of your veins. You may be given: A sedative. This helps you relax. Anesthesia. This will: Numb certain areas of your body. Make you fall asleep for surgery. You will be given medicines as needed to keep you  comfortable. The more medicine you are given, the deeper your level of sedation will be. Your level of sedation may be changed to fit your needs. There are three levels of sedation: Mild sedation. At this level, you may feel awake and relaxed. You will be able to follow directions. Moderate sedation. At this level, you will be sleepy. You may not remember the procedure. Deep sedation. At this level, you will be asleep. You will not remember the procedure. How you get the medicines will depend on your age and the procedure. They may be given as: A pill. This may be taken by mouth (orally) or inserted into the rectum. An injection. This may be into a vein or muscle. A spray through the nose. After your procedure is over, the medicine will be stopped. The procedure may vary among health care providers and hospitals. What happens after the procedure? Your blood pressure, heart rate, breathing rate, and blood oxygen level will be monitored until you leave the hospital or clinic. You may feel sleepy, clumsy, or nauseous. You may not remember what happened during or after the procedure. Sedation can affect you for several hours. Do not drive or use machinery until your health care provider says that it is safe. This information is not intended to replace advice given to you by your health care provider. Make sure you discuss any questions you have with your health care provider. Document Revised: 11/09/2021 Document Reviewed: 11/09/2021 Elsevier Patient Education  2024 [employer].

## 2023-03-30 ENCOUNTER — Encounter (HOSPITAL_COMMUNITY): Payer: Self-pay

## 2023-03-30 ENCOUNTER — Encounter (HOSPITAL_COMMUNITY)
Admission: RE | Admit: 2023-03-30 | Discharge: 2023-03-30 | Disposition: A | Discharge status: Home / Self Care | Payer: Medicare Other | Source: Ambulatory Visit | Attending: Internal Medicine | Admitting: Internal Medicine

## 2023-03-30 VITALS — BP 127/66 | HR 57 | Temp 97.8°F | Resp 18 | Ht 73.0 in | Wt 245.2 lb

## 2023-03-30 DIAGNOSIS — I10 Essential (primary) hypertension: Secondary | ICD-10-CM | POA: Diagnosis not present

## 2023-03-30 DIAGNOSIS — Z01818 Encounter for other preprocedural examination: Secondary | ICD-10-CM | POA: Diagnosis not present

## 2023-03-30 DIAGNOSIS — E0800 Diabetes mellitus due to underlying condition with hyperosmolarity without nonketotic hyperglycemic-hyperosmolar coma (NKHHC): Secondary | ICD-10-CM | POA: Insufficient documentation

## 2023-03-30 HISTORY — DX: Cardiac arrhythmia, unspecified: I49.9

## 2023-03-30 HISTORY — DX: Bronchitis, not specified as acute or chronic: J40

## 2023-03-30 HISTORY — DX: Chronic kidney disease, unspecified: N18.9

## 2023-03-30 HISTORY — DX: Sleep apnea, unspecified: G47.30

## 2023-03-30 HISTORY — DX: Unspecified atrial fibrillation: I48.91

## 2023-03-30 LAB — BASIC METABOLIC PANEL
Anion gap: 9 (ref 5–15)
BUN: 28 mg/dL — ABNORMAL HIGH (ref 8–23)
CO2: 23 mmol/L (ref 22–32)
Calcium: 8.7 mg/dL — ABNORMAL LOW (ref 8.9–10.3)
Chloride: 104 mmol/L (ref 98–111)
Creatinine, Ser: 2.22 mg/dL — ABNORMAL HIGH (ref 0.61–1.24)
GFR, Estimated: 31 mL/min — ABNORMAL LOW (ref >60)
Glucose, Bld: 89 mg/dL (ref 70–99)
Potassium: 3.6 mmol/L (ref 3.5–5.1)
Sodium: 136 mmol/L (ref 135–145)

## 2023-03-31 DIAGNOSIS — D631 Anemia in chronic kidney disease: Secondary | ICD-10-CM | POA: Diagnosis not present

## 2023-03-31 DIAGNOSIS — N183 Chronic kidney disease, stage 3 unspecified: Secondary | ICD-10-CM | POA: Diagnosis not present

## 2023-03-31 DIAGNOSIS — R809 Proteinuria, unspecified: Secondary | ICD-10-CM | POA: Diagnosis not present

## 2023-04-01 ENCOUNTER — Encounter: Payer: Self-pay | Admitting: Podiatry

## 2023-04-01 ENCOUNTER — Ambulatory Visit: Payer: Medicare Other | Admitting: Podiatry

## 2023-04-01 DIAGNOSIS — I129 Hypertensive chronic kidney disease with stage 1 through stage 4 chronic kidney disease, or unspecified chronic kidney disease: Secondary | ICD-10-CM | POA: Diagnosis not present

## 2023-04-01 DIAGNOSIS — M79675 Pain in left toe(s): Secondary | ICD-10-CM

## 2023-04-01 DIAGNOSIS — E119 Type 2 diabetes mellitus without complications: Secondary | ICD-10-CM

## 2023-04-01 DIAGNOSIS — R809 Proteinuria, unspecified: Secondary | ICD-10-CM | POA: Diagnosis not present

## 2023-04-01 DIAGNOSIS — M79674 Pain in right toe(s): Secondary | ICD-10-CM

## 2023-04-01 DIAGNOSIS — B351 Tinea unguium: Secondary | ICD-10-CM | POA: Diagnosis not present

## 2023-04-01 DIAGNOSIS — E1129 Type 2 diabetes mellitus with other diabetic kidney complication: Secondary | ICD-10-CM | POA: Diagnosis not present

## 2023-04-01 DIAGNOSIS — E1122 Type 2 diabetes mellitus with diabetic chronic kidney disease: Secondary | ICD-10-CM | POA: Diagnosis not present

## 2023-04-01 NOTE — Progress Notes (Signed)
This patient returns to my office for at risk foot care.  This patient requires this care by a professional since this patient will be at risk due to having diabetes.  This patient is unable to cut nails himself since the patient cannot reach his nails.These nails are painful walking and wearing shoes.  This patient presents for at risk foot care today.  General Appearance  Alert, conversant and in no acute stress.  Vascular  Dorsalis pedis and posterior tibial  pulses are palpable  bilaterally.  Capillary return is within normal limits  bilaterally. Temperature is within normal limits  bilaterally.  Neurologic  Senn-Weinstein monofilament wire test within normal limits  bilaterally. Muscle power within normal limits bilaterally.  Nails Thick disfigured discolored nails with subungual debris  from hallux to fifth toes bilaterally. No evidence of bacterial infection or drainage bilaterally.  Orthopedic  No limitations of motion  feet .  No crepitus or effusions noted.  No bony pathology or digital deformities noted.  Skin  normotropic skin with no porokeratosis noted bilaterally.  No signs of infections or ulcers noted.     Onychomycosis  Pain in right toes  Pain in left toes  Consent was obtained for treatment procedures.   Mechanical debridement of nails 1-5  bilaterally performed with a nail nipper.  Filed with dremel without incident.    Return office visit  12 weeks                    Told patient to return for periodic foot care and evaluation due to potential at risk complications.   Joyice Magda DPM  

## 2023-04-04 ENCOUNTER — Encounter (HOSPITAL_COMMUNITY): Payer: Self-pay

## 2023-04-04 ENCOUNTER — Encounter (HOSPITAL_COMMUNITY)
Admission: RE | Disposition: A | Discharge status: Home / Self Care | Payer: Self-pay | Source: Home / Self Care | Attending: Internal Medicine

## 2023-04-04 ENCOUNTER — Ambulatory Visit (HOSPITAL_COMMUNITY): Payer: Medicare Other | Admitting: Certified Registered"

## 2023-04-04 ENCOUNTER — Ambulatory Visit (HOSPITAL_COMMUNITY)
Admission: RE | Admit: 2023-04-04 | Discharge: 2023-04-04 | Disposition: A | Discharge status: Home / Self Care | Payer: Medicare Other | Attending: Internal Medicine | Admitting: Internal Medicine

## 2023-04-04 DIAGNOSIS — K635 Polyp of colon: Secondary | ICD-10-CM | POA: Insufficient documentation

## 2023-04-04 DIAGNOSIS — E1122 Type 2 diabetes mellitus with diabetic chronic kidney disease: Secondary | ICD-10-CM | POA: Insufficient documentation

## 2023-04-04 DIAGNOSIS — G4730 Sleep apnea, unspecified: Secondary | ICD-10-CM | POA: Diagnosis not present

## 2023-04-04 DIAGNOSIS — D126 Benign neoplasm of colon, unspecified: Secondary | ICD-10-CM | POA: Diagnosis not present

## 2023-04-04 DIAGNOSIS — R195 Other fecal abnormalities: Secondary | ICD-10-CM | POA: Diagnosis not present

## 2023-04-04 DIAGNOSIS — Z1211 Encounter for screening for malignant neoplasm of colon: Secondary | ICD-10-CM | POA: Insufficient documentation

## 2023-04-04 DIAGNOSIS — D123 Benign neoplasm of transverse colon: Secondary | ICD-10-CM | POA: Insufficient documentation

## 2023-04-04 DIAGNOSIS — I129 Hypertensive chronic kidney disease with stage 1 through stage 4 chronic kidney disease, or unspecified chronic kidney disease: Secondary | ICD-10-CM | POA: Insufficient documentation

## 2023-04-04 DIAGNOSIS — D124 Benign neoplasm of descending colon: Secondary | ICD-10-CM | POA: Diagnosis not present

## 2023-04-04 DIAGNOSIS — F17200 Nicotine dependence, unspecified, uncomplicated: Secondary | ICD-10-CM | POA: Insufficient documentation

## 2023-04-04 DIAGNOSIS — I4891 Unspecified atrial fibrillation: Secondary | ICD-10-CM | POA: Insufficient documentation

## 2023-04-04 DIAGNOSIS — K648 Other hemorrhoids: Secondary | ICD-10-CM | POA: Diagnosis not present

## 2023-04-04 DIAGNOSIS — N189 Chronic kidney disease, unspecified: Secondary | ICD-10-CM

## 2023-04-04 DIAGNOSIS — K5730 Diverticulosis of large intestine without perforation or abscess without bleeding: Secondary | ICD-10-CM | POA: Diagnosis not present

## 2023-04-04 HISTORY — PX: POLYPECTOMY: SHX149

## 2023-04-04 HISTORY — PX: COLONOSCOPY WITH PROPOFOL: SHX5780

## 2023-04-04 LAB — GLUCOSE, CAPILLARY: Glucose-Capillary: 96 mg/dL (ref 70–99)

## 2023-04-04 SURGERY — COLONOSCOPY WITH PROPOFOL
Anesthesia: General

## 2023-04-04 MED ORDER — PROPOFOL 10 MG/ML IV BOLUS
INTRAVENOUS | Status: DC | PRN
  Administered 2023-04-04: 80 mg via INTRAVENOUS

## 2023-04-04 MED ORDER — EPHEDRINE 5 MG/ML INJ
INTRAVENOUS | Status: AC
  Filled 2023-04-04: qty 5

## 2023-04-04 MED ORDER — LIDOCAINE HCL (CARDIAC) PF 100 MG/5ML IV SOSY
PREFILLED_SYRINGE | INTRAVENOUS | Status: DC | PRN
  Administered 2023-04-04: 80 mg via INTRAVENOUS

## 2023-04-04 MED ORDER — LACTATED RINGERS IV SOLN
INTRAVENOUS | Status: DC | PRN
Start: 2023-04-04 — End: 2023-04-04

## 2023-04-04 MED ORDER — PROPOFOL 500 MG/50ML IV EMUL
INTRAVENOUS | Status: DC | PRN
  Administered 2023-04-04: 150 ug/kg/min via INTRAVENOUS

## 2023-04-04 NOTE — Anesthesia Preprocedure Evaluation (Signed)
Anesthesia Evaluation  Patient identified by MRN, date of birth, ID band Patient awake    Reviewed: Allergy & Precautions, H&P , NPO status , Patient's Chart, lab work & pertinent test results, reviewed documented beta blocker date and time   Airway Mallampati: II  TM Distance: >3 FB Neck ROM: full    Dental no notable dental hx.    Pulmonary neg pulmonary ROS, sleep apnea , Current Smoker   Pulmonary exam normal breath sounds clear to auscultation       Cardiovascular Exercise Tolerance: Good hypertension, negative cardio ROS + dysrhythmias Atrial Fibrillation  Rhythm:regular Rate:Normal     Neuro/Psych negative neurological ROS  negative psych ROS   GI/Hepatic negative GI ROS, Neg liver ROS,,,  Endo/Other  negative endocrine ROSdiabetes    Renal/GU Renal diseasenegative Renal ROS  negative genitourinary   Musculoskeletal   Abdominal   Peds  Hematology negative hematology ROS (+)   Anesthesia Other Findings   Reproductive/Obstetrics negative OB ROS                             Anesthesia Physical Anesthesia Plan  ASA: 3  Anesthesia Plan: General   Post-op Pain Management:    Induction:   PONV Risk Score and Plan: Propofol infusion  Airway Management Planned:   Additional Equipment:   Intra-op Plan:   Post-operative Plan:   Informed Consent: I have reviewed the patients History and Physical, chart, labs and discussed the procedure including the risks, benefits and alternatives for the proposed anesthesia with the patient or authorized representative who has indicated his/her understanding and acceptance.     Dental Advisory Given  Plan Discussed with: CRNA  Anesthesia Plan Comments:        Anesthesia Quick Evaluation

## 2023-04-04 NOTE — H&P (Signed)
Primary Care Physician:  Elfredia Nevins, MD Primary Gastroenterologist:  Dr. Marletta Lor  Pre-Procedure History & Physical: HPI:  Alex Morton is a 72 y.o. male is here  for a colonoscopy to be performed for colon cancer screening purposes, positive Cologuard testing.  Past Medical History:  Diagnosis Date   Atrial fibrillation (HCC)    Bronchitis    Chronic kidney disease    Diabetes mellitus without complication (HCC)    Dysrhythmia    Hyperlipidemia    Hypertension    Sleep apnea     Past Surgical History:  Procedure Laterality Date   CHOLECYSTECTOMY     FOOT SURGERY     HIP SURGERY Left    ROTATOR CUFF REPAIR Left    spinal tap      Prior to Admission medications   Medication Sig Start Date End Date Taking? Authorizing Provider  amLODipine (NORVASC) 10 MG tablet Take 10 mg by mouth daily.   Yes [provider]  chlorthalidone (HYGROTON) 25 MG tablet Take 25 mg by mouth daily. 03/05/20  Yes [provider]  gabapentin (NEURONTIN) 600 MG tablet Take 600 mg by mouth 3 (three) times daily. Two tabs at bedtime   Yes [provider]  glimepiride (AMARYL) 2 MG tablet  09/27/16  Yes [provider]  metoprolol (LOPRESSOR) 100 MG tablet Take 100 mg by mouth 2 (two) times daily.   Yes [provider]  pioglitazone (ACTOS) 30 MG tablet Take 30 mg by mouth daily.  11/30/18  Yes [provider]  pravastatin (PRAVACHOL) 10 MG tablet Take 10 mg by mouth every Monday, Wednesday, and Friday at 6 PM.   Yes [provider]  FARXIGA 5 MG TABS tablet SMARTSIG:5 Milligram(s) By Mouth Every Morning 09/20/21   [provider]  lisinopril (ZESTRIL) 20 MG tablet Take by mouth. 03/05/20 03/05/21  [provider]    Allergies as of 03/15/2023   (No Known Allergies)    History reviewed. No pertinent family history.  Social History   Socioeconomic History   Marital status: Media planner    Spouse name: Not on file    Number of children: Not on file   Years of education: Not on file   Highest education level: Not on file  Occupational History   Occupation: Retired  Tobacco Use   Smoking status: Every Day    Current packs/day: 0.50    Types: Cigarettes   Smokeless tobacco: Never   Tobacco comments:    patient explains he is not ready to quit  Vaping Use   Vaping status: Never Used  Substance and Sexual Activity   Alcohol use: No   Drug use: No   Sexual activity: Not Currently  Other Topics Concern   Not on file  Social History Narrative   Not on file   Social Determinants of Health   Financial Resource Strain: Low Risk  (05/06/2021)   Overall Financial Resource Strain (CARDIA)    Difficulty of Paying Living Expenses: Not hard at all  Food Insecurity: No Food Insecurity (11/19/2021)   Hunger Vital Sign    Worried About Running Out of Food in the Last Year: Never true    Ran Out of Food in the Last Year: Never true  Transportation Needs: No Transportation Needs (11/19/2021)   PRAPARE - Administrator, Civil Service (Medical): No    Lack of Transportation (Non-Medical): No  Physical Activity: Not on file  Stress: Not on file  Social Connections:  Not on file  Intimate Partner Violence: Not on file    Review of Systems: See HPI, otherwise negative ROS  Physical Exam: Vital signs in last 24 hours: Temp:  [97.6 F (36.4 C)] 97.6 F (36.4 C) (10/07 1033) Pulse Rate:  [50] 50 (10/07 1033) Resp:  [17] 17 (10/07 1033) BP: (145)/(80) 145/80 (10/07 1033) SpO2:  [100 %] 100 % (10/07 1033) Weight:  [111.2 kg] 111.2 kg (10/07 1033)   General:   Alert,  Well-developed, well-nourished, pleasant and cooperative in NAD Head:  Normocephalic and atraumatic. Eyes:  Sclera clear, no icterus.   Conjunctiva pink. Ears:  Normal auditory acuity. Nose:  No deformity, discharge,  or lesions. Msk:  Symmetrical without gross deformities. Normal posture. Extremities:  Without clubbing or  edema. Neurologic:  Alert and  oriented x4;  grossly normal neurologically. Skin:  Intact without significant lesions or rashes. Psych:  Alert and cooperative. Normal mood and affect.  Impression/Plan: Alex Morton is here for a colonoscopy to be performed for colon cancer screening purposes, positive Cologuard testing.   The risks of the procedure including infection, bleed, or perforation as well as benefits, limitations, alternatives and imponderables have been reviewed with the patient. Questions have been answered. All parties agreeable.

## 2023-04-04 NOTE — Anesthesia Postprocedure Evaluation (Signed)
Anesthesia Post Note  Patient: NUCHEM GRATTAN  Procedure(s) Performed: COLONOSCOPY WITH PROPOFOL POLYPECTOMY INTESTINAL  Patient location during evaluation: Phase II Anesthesia Type: General Level of consciousness: awake Pain management: pain level controlled Vital Signs Assessment: post-procedure vital signs reviewed and stable Respiratory status: spontaneous breathing and respiratory function stable Cardiovascular status: blood pressure returned to baseline and stable Postop Assessment: no headache and no apparent nausea or vomiting Anesthetic complications: no Comments: Late entry   No notable events documented.   Last Vitals:  Vitals:   04/04/23 1033 04/04/23 1228  BP: (!) 145/80 108/61  Pulse: (!) 50 (!) 59  Resp: 17 17  Temp: 36.4 C 36.5 C  SpO2: 100% 100%    Last Pain:  Vitals:   04/04/23 1228  TempSrc: Oral  PainSc: 0-No pain                 Windell Norfolk

## 2023-04-04 NOTE — Discharge Instructions (Addendum)
  Colonoscopy Discharge Instructions  Read the instructions outlined below and refer to this sheet in the next few weeks. These discharge instructions provide you with general information on caring for yourself after you leave the hospital. Your doctor may also give you specific instructions. While your treatment has been planned according to the most current medical practices available, unavoidable complications occasionally occur.   ACTIVITY You may resume your regular activity, but move at a slower pace for the next 24 hours.  Take frequent rest periods for the next 24 hours.  Walking will help get rid of the air and reduce the bloated feeling in your belly (abdomen).  No driving for 24 hours (because of the medicine (anesthesia) used during the test).   Do not sign any important legal documents or operate any machinery for 24 hours (because of the anesthesia used during the test).  NUTRITION Drink plenty of fluids.  You may resume your normal diet as instructed by your doctor.  Begin with a light meal and progress to your normal diet. Heavy or fried foods are harder to digest and may make you feel sick to your stomach (nauseated).  Avoid alcoholic beverages for 24 hours or as instructed.  MEDICATIONS You may resume your normal medications unless your doctor tells you otherwise.  WHAT YOU CAN EXPECT TODAY Some feelings of bloating in the abdomen.  Passage of more gas than usual.  Spotting of blood in your stool or on the toilet paper.  IF YOU HAD POLYPS REMOVED DURING THE COLONOSCOPY: No aspirin products for 7 days or as instructed.  No alcohol for 7 days or as instructed.  Eat a soft diet for the next 24 hours.  FINDING OUT THE RESULTS OF YOUR TEST Not all test results are available during your visit. If your test results are not back during the visit, make an appointment with your caregiver to find out the results. Do not assume everything is normal if you have not heard from your  caregiver or the medical facility. It is important for you to follow up on all of your test results.  SEEK IMMEDIATE MEDICAL ATTENTION IF: You have more than a spotting of blood in your stool.  Your belly is swollen (abdominal distention).  You are nauseated or vomiting.  You have a temperature over 101.  You have abdominal pain or discomfort that is severe or gets worse throughout the day.   Your colonoscopy revealed 5 polyp(s) which I removed successfully. Await pathology results, my office will contact you. I recommend repeating colonoscopy in 3 years for surveillance purposes.   You also have diverticulosis and internal hemorrhoids. I would recommend increasing fiber in your diet or adding OTC Benefiber/Metamucil. Be sure to drink at least 4 to 6 glasses of water daily. Follow-up with GI as needed.   I hope you have a great rest of your week!  Cassundra Mckeever K. Shahmeer Bunn, D.O. Gastroenterology and Hepatology [facility]  

## 2023-04-04 NOTE — Op Note (Signed)
[facility] Patient Name: Alex Morton Procedure Date: 04/04/2023 11:44 AM MRN: [id number] Date of Birth: Feb 02, 1951 Attending MD: Hennie Duos. Marletta Lor , [place], [id number] CSN: [id number] Age: 72 Admit Type: Outpatient Procedure:                Colonoscopy Indications:              Screening for colorectal malignant neoplasm,                            Incidental - Positive Cologuard test Providers:                Hennie Duos. Marletta Lor, DO, Crystal Page, Dyann Ruddle Referring MD:              Medicines:                See the Anesthesia note for documentation of the                            administered medications Complications:            No immediate complications. Estimated Blood Loss:     Estimated blood loss was minimal. Procedure:                Pre-Anesthesia Assessment:                           - The anesthesia plan was to use monitored                            anesthesia care (MAC).                           After obtaining informed consent, the colonoscope                            was passed under direct vision. Throughout the                            procedure, the patient's blood pressure, pulse, and                            oxygen saturations were monitored continuously. The                            [id number]-HQ190L ([id number]) scope was introduced through                            the anus and advanced to the the cecum, identified                            by appendiceal orifice and ileocecal valve. The                            colonoscopy was performed without difficulty. The                            patient tolerated  the procedure well. The quality                            of the bowel preparation was evaluated using the                            BBPS [facility] Bowel Preparation Scale) with scores                            of: Right Colon = 3, Transverse Colon = 3 and Left                            Colon = 3 (entire mucosa seen well with no residual                             staining, small fragments of stool or opaque                            liquid). The total BBPS score equals 9. Scope In: 12:01:57 PM Scope Out: 12:23:51 PM Scope Withdrawal Time: 0 hours 17 minutes 48 seconds  Total Procedure Duration: 0 hours 21 minutes 54 seconds  Findings:      Internal hemorrhoids were found.      Multiple medium-mouthed and small-mouthed diverticula were found in the       sigmoid colon and descending colon.      A 15 mm polyp was found in the transverse colon. The polyp was sessile.       The polyp was removed with a cold snare. Resection and retrieval were       complete.      Four sessile polyps were found in the sigmoid colon and descending       colon. The polyps were 4 to 6 mm in size. These polyps were removed with       a cold snare. Resection and retrieval were complete. Impression:               - Internal hemorrhoids.                           - Diverticulosis in the sigmoid colon and in the                            descending colon.                           - One 15 mm polyp in the transverse colon, removed                            with a cold snare. Resected and retrieved.                           - Four 4 to 6 mm polyps in the sigmoid colon and in                            the descending colon, removed with a  cold snare.                            Resected and retrieved. Moderate Sedation:      Per Anesthesia Care Recommendation:           - Patient has a contact number available for                            emergencies. The signs and symptoms of potential                            delayed complications were discussed with the                            patient. Return to normal activities tomorrow.                            Written discharge instructions were provided to the                            patient.                           - Resume previous diet.                           - Continue present  medications.                           - Await pathology results.                           - Repeat colonoscopy in 3 years for surveillance.                           - Return to GI clinic PRN. Procedure Code(s):        --- Professional ---                           [phone], Colonoscopy, flexible; with removal of                            tumor(s), polyp(s), or other lesion(s) by snare                            technique Diagnosis Code(s):        --- Professional ---                           Z12.11, Encounter for screening for malignant                            neoplasm of colon                           K64.8, Other hemorrhoids  D12.3, Benign neoplasm of transverse colon (hepatic                            flexure or splenic flexure)                           D12.5, Benign neoplasm of sigmoid colon                           D12.4, Benign neoplasm of descending colon                           K57.30, Diverticulosis of large intestine without                            perforation or abscess without bleeding CPT copyright 2022 American Medical Association. All rights reserved. The codes documented in this report are preliminary and upon coder review may  be revised to meet current compliance requirements. Hennie Duos. Marletta Lor, DO Hennie Duos. Marletta Lor, DO 04/04/2023 16:10:96 PM This report has been signed electronically. Number of Addenda: 0

## 2023-04-04 NOTE — Transfer of Care (Signed)
Immediate Anesthesia Transfer of Care Note  Patient: Alex Morton  Procedure(s) Performed: COLONOSCOPY WITH PROPOFOL POLYPECTOMY INTESTINAL  Patient Location: Short Stay  Anesthesia Type:General  Level of Consciousness: drowsy  Airway & Oxygen Therapy: Patient Spontanous Breathing  Post-op Assessment: Report given to RN and Post -op Vital signs reviewed and stable  Post vital signs: Reviewed and stable  Last Vitals:  Vitals Value Taken Time  BP 108/61 04/04/23 1228  Temp 36.5 C 04/04/23 1228  Pulse 59 04/04/23 1228  Resp 17 04/04/23 1228  SpO2 100 % 04/04/23 1228    Last Pain:  Vitals:   04/04/23 1228  TempSrc: Oral  PainSc: 0-No pain         Complications: No notable events documented.

## 2023-04-06 LAB — SURGICAL PATHOLOGY

## 2023-04-12 ENCOUNTER — Encounter (HOSPITAL_COMMUNITY): Payer: Self-pay | Admitting: Internal Medicine

## 2023-04-25 DIAGNOSIS — E538 Deficiency of other specified B group vitamins: Secondary | ICD-10-CM | POA: Diagnosis not present

## 2023-04-28 DIAGNOSIS — E1140 Type 2 diabetes mellitus with diabetic neuropathy, unspecified: Secondary | ICD-10-CM | POA: Diagnosis not present

## 2023-04-28 DIAGNOSIS — E782 Mixed hyperlipidemia: Secondary | ICD-10-CM | POA: Diagnosis not present

## 2023-04-28 DIAGNOSIS — I129 Hypertensive chronic kidney disease with stage 1 through stage 4 chronic kidney disease, or unspecified chronic kidney disease: Secondary | ICD-10-CM | POA: Diagnosis not present

## 2023-06-07 DIAGNOSIS — E538 Deficiency of other specified B group vitamins: Secondary | ICD-10-CM | POA: Diagnosis not present

## 2023-06-28 DIAGNOSIS — E782 Mixed hyperlipidemia: Secondary | ICD-10-CM | POA: Diagnosis not present

## 2023-06-28 DIAGNOSIS — E1140 Type 2 diabetes mellitus with diabetic neuropathy, unspecified: Secondary | ICD-10-CM | POA: Diagnosis not present

## 2023-07-05 DIAGNOSIS — I129 Hypertensive chronic kidney disease with stage 1 through stage 4 chronic kidney disease, or unspecified chronic kidney disease: Secondary | ICD-10-CM | POA: Diagnosis not present

## 2023-07-05 DIAGNOSIS — E1129 Type 2 diabetes mellitus with other diabetic kidney complication: Secondary | ICD-10-CM | POA: Diagnosis not present

## 2023-07-05 DIAGNOSIS — E1122 Type 2 diabetes mellitus with diabetic chronic kidney disease: Secondary | ICD-10-CM | POA: Diagnosis not present

## 2023-07-07 DIAGNOSIS — I129 Hypertensive chronic kidney disease with stage 1 through stage 4 chronic kidney disease, or unspecified chronic kidney disease: Secondary | ICD-10-CM | POA: Diagnosis not present

## 2023-07-07 DIAGNOSIS — D638 Anemia in other chronic diseases classified elsewhere: Secondary | ICD-10-CM | POA: Diagnosis not present

## 2023-07-07 DIAGNOSIS — R809 Proteinuria, unspecified: Secondary | ICD-10-CM | POA: Diagnosis not present

## 2023-07-07 DIAGNOSIS — E1122 Type 2 diabetes mellitus with diabetic chronic kidney disease: Secondary | ICD-10-CM | POA: Diagnosis not present

## 2023-07-08 ENCOUNTER — Ambulatory Visit: Payer: Medicare Other | Admitting: Podiatry

## 2023-07-08 ENCOUNTER — Encounter: Payer: Self-pay | Admitting: Podiatry

## 2023-07-08 DIAGNOSIS — M79674 Pain in right toe(s): Secondary | ICD-10-CM | POA: Diagnosis not present

## 2023-07-08 DIAGNOSIS — M79675 Pain in left toe(s): Secondary | ICD-10-CM

## 2023-07-08 DIAGNOSIS — E119 Type 2 diabetes mellitus without complications: Secondary | ICD-10-CM

## 2023-07-08 DIAGNOSIS — B351 Tinea unguium: Secondary | ICD-10-CM

## 2023-07-08 NOTE — Progress Notes (Signed)
This patient returns to my office for at risk foot care.  This patient requires this care by a professional since this patient will be at risk due to having diabetes.  This patient is unable to cut nails himself since the patient cannot reach his nails.These nails are painful walking and wearing shoes.  This patient presents for at risk foot care today.  General Appearance  Alert, conversant and in no acute stress.  Vascular  Dorsalis pedis and posterior tibial  pulses are palpable  bilaterally.  Capillary return is within normal limits  bilaterally. Temperature is within normal limits  bilaterally.  Neurologic  Senn-Weinstein monofilament wire test within normal limits  bilaterally. Muscle power within normal limits bilaterally.  Nails Thick disfigured discolored nails with subungual debris  from hallux to fifth toes bilaterally. No evidence of bacterial infection or drainage bilaterally.  Orthopedic  No limitations of motion  feet .  No crepitus or effusions noted.  No bony pathology or digital deformities noted.  Skin  normotropic skin with no porokeratosis noted bilaterally.  No signs of infections or ulcers noted.     Onychomycosis  Pain in right toes  Pain in left toes  Consent was obtained for treatment procedures.   Mechanical debridement of nails 1-5  bilaterally performed with a nail nipper.  Filed with dremel without incident.    Return office visit  12 weeks                    Told patient to return for periodic foot care and evaluation due to potential at risk complications.   Joyice Magda DPM  

## 2023-07-27 DIAGNOSIS — E538 Deficiency of other specified B group vitamins: Secondary | ICD-10-CM | POA: Diagnosis not present

## 2023-09-05 DIAGNOSIS — E538 Deficiency of other specified B group vitamins: Secondary | ICD-10-CM | POA: Diagnosis not present

## 2023-10-07 ENCOUNTER — Ambulatory Visit: Payer: Medicare Other | Admitting: Podiatry

## 2023-10-10 DIAGNOSIS — D631 Anemia in chronic kidney disease: Secondary | ICD-10-CM | POA: Diagnosis not present

## 2023-10-10 DIAGNOSIS — N189 Chronic kidney disease, unspecified: Secondary | ICD-10-CM | POA: Diagnosis not present

## 2023-10-10 DIAGNOSIS — R809 Proteinuria, unspecified: Secondary | ICD-10-CM | POA: Diagnosis not present

## 2023-10-10 DIAGNOSIS — D649 Anemia, unspecified: Secondary | ICD-10-CM | POA: Diagnosis not present

## 2023-10-13 ENCOUNTER — Ambulatory Visit: Admitting: Podiatry

## 2023-10-13 ENCOUNTER — Encounter: Payer: Self-pay | Admitting: Podiatry

## 2023-10-13 DIAGNOSIS — M79674 Pain in right toe(s): Secondary | ICD-10-CM

## 2023-10-13 DIAGNOSIS — B351 Tinea unguium: Secondary | ICD-10-CM | POA: Diagnosis not present

## 2023-10-13 DIAGNOSIS — M79675 Pain in left toe(s): Secondary | ICD-10-CM | POA: Diagnosis not present

## 2023-10-13 DIAGNOSIS — E119 Type 2 diabetes mellitus without complications: Secondary | ICD-10-CM | POA: Diagnosis not present

## 2023-10-13 NOTE — Progress Notes (Signed)
This patient returns to my office for at risk foot care.  This patient requires this care by a professional since this patient will be at risk due to having diabetes.  This patient is unable to cut nails himself since the patient cannot reach his nails.These nails are painful walking and wearing shoes.  This patient presents for at risk foot care today.  General Appearance  Alert, conversant and in no acute stress.  Vascular  Dorsalis pedis and posterior tibial  pulses are palpable  bilaterally.  Capillary return is within normal limits  bilaterally. Temperature is within normal limits  bilaterally.  Neurologic  Senn-Weinstein monofilament wire test within normal limits  bilaterally. Muscle power within normal limits bilaterally.  Nails Thick disfigured discolored nails with subungual debris  from hallux to fifth toes bilaterally. No evidence of bacterial infection or drainage bilaterally.  Orthopedic  No limitations of motion  feet .  No crepitus or effusions noted.  No bony pathology or digital deformities noted.  Skin  normotropic skin with no porokeratosis noted bilaterally.  No signs of infections or ulcers noted.     Onychomycosis  Pain in right toes  Pain in left toes  Consent was obtained for treatment procedures.   Mechanical debridement of nails 1-5  bilaterally performed with a nail nipper.  Filed with dremel without incident.    Return office visit  12 weeks                    Told patient to return for periodic foot care and evaluation due to potential at risk complications.   Joyice Magda DPM  

## 2023-10-14 DIAGNOSIS — D638 Anemia in other chronic diseases classified elsewhere: Secondary | ICD-10-CM | POA: Diagnosis not present

## 2023-10-14 DIAGNOSIS — E1129 Type 2 diabetes mellitus with other diabetic kidney complication: Secondary | ICD-10-CM | POA: Diagnosis not present

## 2023-10-14 DIAGNOSIS — I129 Hypertensive chronic kidney disease with stage 1 through stage 4 chronic kidney disease, or unspecified chronic kidney disease: Secondary | ICD-10-CM | POA: Diagnosis not present

## 2023-10-14 DIAGNOSIS — R809 Proteinuria, unspecified: Secondary | ICD-10-CM | POA: Diagnosis not present

## 2023-10-25 DIAGNOSIS — E538 Deficiency of other specified B group vitamins: Secondary | ICD-10-CM | POA: Diagnosis not present

## 2023-11-25 DIAGNOSIS — E538 Deficiency of other specified B group vitamins: Secondary | ICD-10-CM | POA: Diagnosis not present

## 2023-12-21 DIAGNOSIS — E538 Deficiency of other specified B group vitamins: Secondary | ICD-10-CM | POA: Diagnosis not present

## 2024-01-12 ENCOUNTER — Ambulatory Visit: Admitting: Podiatry

## 2024-01-12 ENCOUNTER — Encounter: Payer: Self-pay | Admitting: Podiatry

## 2024-01-12 DIAGNOSIS — M79675 Pain in left toe(s): Secondary | ICD-10-CM

## 2024-01-12 DIAGNOSIS — B351 Tinea unguium: Secondary | ICD-10-CM | POA: Diagnosis not present

## 2024-01-12 DIAGNOSIS — E119 Type 2 diabetes mellitus without complications: Secondary | ICD-10-CM

## 2024-01-12 DIAGNOSIS — M79674 Pain in right toe(s): Secondary | ICD-10-CM

## 2024-01-12 NOTE — Progress Notes (Signed)
This patient returns to my office for at risk foot care.  This patient requires this care by a professional since this patient will be at risk due to having diabetes.  This patient is unable to cut nails himself since the patient cannot reach his nails.These nails are painful walking and wearing shoes.  This patient presents for at risk foot care today.  General Appearance  Alert, conversant and in no acute stress.  Vascular  Dorsalis pedis and posterior tibial  pulses are palpable  bilaterally.  Capillary return is within normal limits  bilaterally. Temperature is within normal limits  bilaterally.  Neurologic  Senn-Weinstein monofilament wire test within normal limits  bilaterally. Muscle power within normal limits bilaterally.  Nails Thick disfigured discolored nails with subungual debris  from hallux to fifth toes bilaterally. No evidence of bacterial infection or drainage bilaterally.  Orthopedic  No limitations of motion  feet .  No crepitus or effusions noted.  No bony pathology or digital deformities noted.  Skin  normotropic skin with no porokeratosis noted bilaterally.  No signs of infections or ulcers noted.     Onychomycosis  Pain in right toes  Pain in left toes  Consent was obtained for treatment procedures.   Mechanical debridement of nails 1-5  bilaterally performed with a nail nipper.  Filed with dremel without incident.    Return office visit  12 weeks                    Told patient to return for periodic foot care and evaluation due to potential at risk complications.   Joyice Magda DPM  

## 2024-01-25 DIAGNOSIS — E538 Deficiency of other specified B group vitamins: Secondary | ICD-10-CM | POA: Diagnosis not present

## 2024-01-30 ENCOUNTER — Ambulatory Visit
Admission: EM | Admit: 2024-01-30 | Discharge: 2024-01-30 | Disposition: A | Discharge status: Home / Self Care | Attending: Family Medicine | Admitting: Family Medicine

## 2024-01-30 ENCOUNTER — Ambulatory Visit (INDEPENDENT_AMBULATORY_CARE_PROVIDER_SITE_OTHER)

## 2024-01-30 DIAGNOSIS — Z7984 Long term (current) use of oral hypoglycemic drugs: Secondary | ICD-10-CM

## 2024-01-30 DIAGNOSIS — M25562 Pain in left knee: Secondary | ICD-10-CM | POA: Diagnosis not present

## 2024-01-30 DIAGNOSIS — E119 Type 2 diabetes mellitus without complications: Secondary | ICD-10-CM | POA: Diagnosis not present

## 2024-01-30 MED ORDER — PREDNISONE 20 MG PO TABS: 20.0000 mg | ORAL_TABLET | Freq: Every day | ORAL | 0 refills | Status: AC

## 2024-01-30 MED ORDER — DICLOFENAC SODIUM 1 % EX GEL: 2.0000 g | Freq: Four times a day (QID) | CUTANEOUS | 0 refills | Status: AC

## 2024-01-30 MED ORDER — ACETAMINOPHEN 325 MG PO TABS
650.0000 mg | ORAL_TABLET | Freq: Once | ORAL | Status: AC
  Administered 2024-01-30: 650 mg via ORAL

## 2024-01-30 NOTE — ED Triage Notes (Signed)
 Pt reports fall that occurred this morning causing left knee pain. Pt visible unable to bear weight  to the left leg.

## 2024-01-30 NOTE — ED Provider Notes (Signed)
 [facility]    CSN: [id number] Arrival date & time: 01/30/24  1743      History   Chief Complaint No chief complaint on file.   HPI Alex Morton is a 73 y.o. male.   Patient presenting today with 1 day history of left medial knee pain after falling and twisting the knee.  Denies significant swelling, discoloration, skin injury, numbness, tingling, weakness and states he is able to bear weight when still on the leg but unable to tolerate walking bearing weight on the leg.  So far not trying anything over-the-counter for symptoms.    Past Medical History:  Diagnosis Date   Atrial fibrillation (HCC)    Bronchitis    Chronic kidney disease    Diabetes mellitus without complication (HCC)    Dysrhythmia    Hyperlipidemia    Hypertension    Sleep apnea     Patient Active Problem List   Diagnosis Date Noted   HTN (hypertension) 06/11/2019   Diabetes (HCC) 06/11/2019   Hyperlipemia 06/11/2019    Past Surgical History:  Procedure Laterality Date   CHOLECYSTECTOMY     COLONOSCOPY WITH PROPOFOL  N/A 04/04/2023   Procedure: COLONOSCOPY WITH PROPOFOL ;  Surgeon: Cindie Carlin POUR, DO;  Location: [facility];  Service: Endoscopy;  Laterality: N/A;  12:30pm, asa 3   FOOT SURGERY     HIP SURGERY Left    POLYPECTOMY  04/04/2023   Procedure: POLYPECTOMY INTESTINAL;  Surgeon: Cindie Carlin POUR, DO;  Location: [facility];  Service: Endoscopy;;   ROTATOR CUFF REPAIR Left    spinal tap         Home Medications    Prior to Admission medications   Medication Sig Start Date End Date Taking? Authorizing Provider  diclofenac  Sodium (VOLTAREN  ARTHRITIS PAIN) 1 % GEL Apply 2 g topically 4 (four) times daily. 01/30/24  Yes Stuart Vernell Norris, PA-C  predniSONE  (DELTASONE ) 20 MG tablet Take 1 tablet (20 mg total) by mouth daily with breakfast. 01/30/24  Yes Stuart Vernell Norris, PA-C  amLODipine (NORVASC) 10 MG tablet Take 10 mg by mouth daily.    [provider]  chlorthalidone (HYGROTON) 25 MG tablet Take 25 mg by mouth daily. 03/05/20   [provider]  FARXIGA 5 MG TABS tablet SMARTSIG:5 Milligram(s) By Mouth Every Morning 09/20/21   [provider]  gabapentin (NEURONTIN) 600 MG tablet Take 600 mg by mouth 3 (three) times daily. Two tabs at bedtime    [provider]  glimepiride (AMARYL) 2 MG tablet  09/27/16   [provider]  lisinopril (ZESTRIL) 20 MG tablet Take by mouth. 03/05/20 03/05/21  [provider]  metoprolol (LOPRESSOR) 100 MG tablet Take 100 mg by mouth 2 (two) times daily.    [provider]  pioglitazone (ACTOS) 30 MG tablet Take 30 mg by mouth daily.  11/30/18   [provider]  pravastatin (PRAVACHOL) 10 MG tablet Take 10 mg by mouth every Monday, Wednesday, and Friday at 6 PM.    [provider]    Family History History reviewed. No pertinent family history.  Social History Social History   Tobacco Use   Smoking status: Every Day    Current packs/day: 0.50    Types: Cigarettes   Smokeless tobacco: Never   Tobacco comments:    patient explains he is not ready to quit  Vaping Use   Vaping status: Never Used  Substance Use Topics   Alcohol use: No  Drug use: No     Allergies   Patient has no known allergies.   Review of Systems Review of Systems Per HPI  Physical Exam Triage Vital Signs ED Triage Vitals  Encounter Vitals Group     BP 01/30/24 1831 (!) 152/74     Girls Systolic BP Percentile --      Girls Diastolic BP Percentile --      Boys Systolic BP Percentile --      Boys Diastolic BP Percentile --      Pulse Rate 01/30/24 1831 86     Resp 01/30/24 1831 18     Temp 01/30/24 1831 98.5 F (36.9 C)     Temp Source 01/30/24 1831 Oral     SpO2 01/30/24 1831 93 %     Weight --      Height --      Head Circumference --      Peak Flow --      Pain Score 01/30/24 1834 6     Pain Loc --      Pain Education --       Exclude from Growth Chart --    No data found.  Updated Vital Signs BP (!) 152/74 (BP Location: Right Arm)   Pulse 86   Temp 98.5 F (36.9 C) (Oral)   Resp 18   SpO2 93%   Visual Acuity Right Eye Distance:   Left Eye Distance:   Bilateral Distance:    Right Eye Near:   Left Eye Near:    Bilateral Near:     Physical Exam Vitals and nursing note reviewed.  Constitutional:      Appearance: Normal appearance.  HENT:     Head: Atraumatic.  Eyes:     Extraocular Movements: Extraocular movements intact.     Conjunctiva/sclera: Conjunctivae normal.  Cardiovascular:     Rate and Rhythm: Normal rate and regular rhythm.  Pulmonary:     Effort: Pulmonary effort is normal.     Breath sounds: Normal breath sounds.  Musculoskeletal:        General: Swelling and signs of injury present. No tenderness or deformity. Normal range of motion.     Cervical back: Normal range of motion and neck supple.     Comments: Trace edema to the left medial aspect of the knee.  Negative McMurray's, negative drawer testing but significant tenderness to palpation with extension of the left leg  Skin:    General: Skin is warm and dry.     Findings: No bruising or erythema.  Neurological:     General: No focal deficit present.     Mental Status: He is oriented to person, place, and time.     Comments: Left lower extremity neurovascularly intact  Psychiatric:        Mood and Affect: Mood normal.        Thought Content: Thought content normal.        Judgment: Judgment normal.      UC Treatments / Results  Labs (all labs ordered are listed, but only abnormal results are displayed) Labs Reviewed - No data to display  EKG   Radiology DG Knee Complete 4 Views Left Result Date: 01/30/2024 CLINICAL DATA:  Left knee pain after fall with twisting motion. EXAM: LEFT KNEE - COMPLETE 4+ VIEW COMPARISON:  Remote knee radiograph 01/29/2014 FINDINGS: No evidence of fracture, dislocation, or joint effusion.  Mild tricompartmental osteoarthritis. Small quadriceps and patellar tendon enthesophytes. Peripheral vascular calcifications. IMPRESSION: 1. No  acute fracture or subluxation of the left knee. 2. Mild tricompartmental osteoarthritis. Electronically Signed   By: Andrea Gasman M.D.   On: 01/30/2024 19:24    Procedures Procedures (including critical care time)  Medications Ordered in UC Medications  acetaminophen  (TYLENOL ) tablet 650 mg (650 mg Oral Given 01/30/24 1922)    Initial Impression / Assessment and Plan / UC Course  I have reviewed the triage vital signs and the nursing notes.  Pertinent labs & imaging results that were available during my care of the patient were reviewed by me and considered in my medical decision making (see chart for details).     X-ray of the left knee negative for acute bony abnormality.  Tylenol  given for pain control, Ace wrap applied and crutches administered per his request to help with ambulation.  Suspect soft tissue strain versus ligament or meniscus injury and discussed Ortho follow-up if worsening or not resolving after several weeks.  Voltaren  gel, very low-dose short course of prednisone  for pain and inflammation, RICE protocol reviewed.  Knows to watch blood sugars closely while on the prednisone  and states they have been under very good control around 90 to low 100s range.  Final Clinical Impressions(s) / UC Diagnoses   Final diagnoses:  Acute pain of left knee  Type 2 diabetes mellitus without complication, without long-term current use of insulin (HCC)     Discharge Instructions      Rest, ice, compression, elevation, Voltaren  gel topically.  I have also prescribed a very short course of low-dose prednisone  to help with the inflammation and pain.  Be mindful that this will elevate your blood sugars temporarily so keep a close check on this.  Your x-ray was negative for any fractures.  Follow-up with orthopedics if worsening or not  resolving    ED Prescriptions     Medication Sig Dispense Auth. Provider   predniSONE  (DELTASONE ) 20 MG tablet Take 1 tablet (20 mg total) by mouth daily with breakfast. 3 tablet Stuart Vernell Norris, PA-C   diclofenac  Sodium (VOLTAREN  ARTHRITIS PAIN) 1 % GEL Apply 2 g topically 4 (four) times daily. 150 g Stuart Vernell Norris, [place]      PDMP not reviewed this encounter.   Stuart Vernell Norris, [place] 01/30/24 1955

## 2024-01-30 NOTE — Discharge Instructions (Signed)
 Rest, ice, compression, elevation, Voltaren  gel topically.  I have also prescribed a very short course of low-dose prednisone  to help with the inflammation and pain.  Be mindful that this will elevate your blood sugars temporarily so keep a close check on this.  Your x-ray was negative for any fractures.  Follow-up with orthopedics if worsening or not resolving

## 2024-02-07 ENCOUNTER — Ambulatory Visit (INDEPENDENT_AMBULATORY_CARE_PROVIDER_SITE_OTHER): Admitting: Podiatry

## 2024-02-07 DIAGNOSIS — M79606 Pain in leg, unspecified: Secondary | ICD-10-CM

## 2024-02-08 DIAGNOSIS — M1712 Unilateral primary osteoarthritis, left knee: Secondary | ICD-10-CM | POA: Diagnosis not present

## 2024-02-09 DIAGNOSIS — I10 Essential (primary) hypertension: Secondary | ICD-10-CM | POA: Diagnosis not present

## 2024-02-09 DIAGNOSIS — N189 Chronic kidney disease, unspecified: Secondary | ICD-10-CM | POA: Diagnosis not present

## 2024-02-09 DIAGNOSIS — E119 Type 2 diabetes mellitus without complications: Secondary | ICD-10-CM | POA: Diagnosis not present

## 2024-02-09 DIAGNOSIS — D631 Anemia in chronic kidney disease: Secondary | ICD-10-CM | POA: Diagnosis not present

## 2024-02-09 DIAGNOSIS — R809 Proteinuria, unspecified: Secondary | ICD-10-CM | POA: Diagnosis not present

## 2024-02-13 DIAGNOSIS — E1129 Type 2 diabetes mellitus with other diabetic kidney complication: Secondary | ICD-10-CM | POA: Diagnosis not present

## 2024-02-13 DIAGNOSIS — R809 Proteinuria, unspecified: Secondary | ICD-10-CM | POA: Diagnosis not present

## 2024-02-13 DIAGNOSIS — N184 Chronic kidney disease, stage 4 (severe): Secondary | ICD-10-CM | POA: Diagnosis not present

## 2024-02-20 NOTE — Progress Notes (Signed)
 Patient left without being seen.  He presented complaint of leg pain which we explained we do not treat.

## 2024-02-22 DIAGNOSIS — M25562 Pain in left knee: Secondary | ICD-10-CM | POA: Diagnosis not present

## 2024-03-05 DIAGNOSIS — E538 Deficiency of other specified B group vitamins: Secondary | ICD-10-CM | POA: Diagnosis not present

## 2024-04-09 ENCOUNTER — Other Ambulatory Visit (HOSPITAL_COMMUNITY): Payer: Self-pay

## 2024-04-09 DIAGNOSIS — Z720 Tobacco use: Secondary | ICD-10-CM

## 2024-04-16 ENCOUNTER — Encounter: Payer: Self-pay | Admitting: Podiatry

## 2024-04-16 ENCOUNTER — Ambulatory Visit: Admitting: Podiatry

## 2024-04-16 DIAGNOSIS — M79674 Pain in right toe(s): Secondary | ICD-10-CM | POA: Diagnosis not present

## 2024-04-16 DIAGNOSIS — E119 Type 2 diabetes mellitus without complications: Secondary | ICD-10-CM | POA: Diagnosis not present

## 2024-04-16 DIAGNOSIS — B351 Tinea unguium: Secondary | ICD-10-CM | POA: Diagnosis not present

## 2024-04-16 DIAGNOSIS — M79675 Pain in left toe(s): Secondary | ICD-10-CM

## 2024-04-16 NOTE — Progress Notes (Signed)
This patient returns to my office for at risk foot care.  This patient requires this care by a professional since this patient will be at risk due to having diabetes.  This patient is unable to cut nails himself since the patient cannot reach his nails.These nails are painful walking and wearing shoes.  This patient presents for at risk foot care today.  General Appearance  Alert, conversant and in no acute stress.  Vascular  Dorsalis pedis and posterior tibial  pulses are palpable  bilaterally.  Capillary return is within normal limits  bilaterally. Temperature is within normal limits  bilaterally.  Neurologic  Senn-Weinstein monofilament wire test within normal limits  bilaterally. Muscle power within normal limits bilaterally.  Nails Thick disfigured discolored nails with subungual debris  from hallux to fifth toes bilaterally. No evidence of bacterial infection or drainage bilaterally.  Orthopedic  No limitations of motion  feet .  No crepitus or effusions noted.  No bony pathology or digital deformities noted.  Skin  normotropic skin with no porokeratosis noted bilaterally.  No signs of infections or ulcers noted.     Onychomycosis  Pain in right toes  Pain in left toes  Consent was obtained for treatment procedures.   Mechanical debridement of nails 1-5  bilaterally performed with a nail nipper.  Filed with dremel without incident.    Return office visit  12 weeks                    Told patient to return for periodic foot care and evaluation due to potential at risk complications.   Joyice Magda DPM  

## 2024-05-14 ENCOUNTER — Encounter: Payer: Self-pay | Admitting: *Deleted

## 2024-05-14 NOTE — Progress Notes (Signed)
 Alex Morton                                          MRN: [id number]   05/14/2024   The [facility] Quality Team Specialist reviewed this patient medical record for the purposes of chart review for care gap closure. The following were reviewed: chart review for care gap closure-diabetic eye exam and glycemic status assessment.    [facility] Quality Team

## 2024-05-29 ENCOUNTER — Encounter (HOSPITAL_COMMUNITY): Payer: Self-pay

## 2024-05-29 ENCOUNTER — Ambulatory Visit (HOSPITAL_COMMUNITY): Admission: RE | Admit: 2024-05-29 | Source: Ambulatory Visit

## 2024-07-16 ENCOUNTER — Ambulatory Visit: Admitting: Podiatry

## 2024-07-16 ENCOUNTER — Encounter: Payer: Self-pay | Admitting: Podiatry

## 2024-07-16 DIAGNOSIS — M79674 Pain in right toe(s): Secondary | ICD-10-CM

## 2024-07-16 DIAGNOSIS — B351 Tinea unguium: Secondary | ICD-10-CM | POA: Diagnosis not present

## 2024-07-16 DIAGNOSIS — E119 Type 2 diabetes mellitus without complications: Secondary | ICD-10-CM

## 2024-07-16 DIAGNOSIS — M79675 Pain in left toe(s): Secondary | ICD-10-CM | POA: Diagnosis not present

## 2024-07-16 NOTE — Progress Notes (Addendum)
 This patient returns to my office for at risk foot care.  This patient requires this care by a professional since this patient will be at risk due to having diabetes.  This patient is unable to cut nails himself since the patient cannot reach his nails.These nails are painful walking and wearing shoes.  This patient presents for at risk foot care today.  General Appearance  Alert, conversant and in no acute stress.  Vascular  Dorsalis pedis and posterior tibial  pulses are palpable  bilaterally.  Capillary return is within normal limits  bilaterally. Temperature is within normal limits  bilaterally.  Neurologic  Senn-Weinstein monofilament wire test within normal limits  bilaterally. Muscle power within normal limits bilaterally.  Nails Thick disfigured discolored nails with subungual debris  from hallux to fifth toes bilaterally. No evidence of bacterial infection or drainage bilaterally.  Orthopedic  No limitations of motion  feet .  No crepitus or effusions noted.  No bony pathology or digital deformities noted.  Skin  normotropic skin with no porokeratosis noted bilaterally.  No signs of infections or ulcers noted.     Onychomycosis  Pain in right toes  Pain in left toes  Consent was obtained for treatment procedures.   Mechanical debridement of nails 1-5  bilaterally performed with a nail nipper.  Filed with dremel without incident.    Return office visit  12 weeks                    Told patient to return for periodic foot care and evaluation due to potential at risk complications.   Cordella Bold ROSENA weldon

## 2024-10-15 ENCOUNTER — Ambulatory Visit: Admitting: Podiatry
